# Patient Record
Sex: Female | Born: 1975 | Race: White | Hispanic: No | Marital: Married | Smoking: Former smoker
Health system: Southern US, Community
[De-identification: ages and names within clinical notes are randomized; demographics above are authoritative.]

## PROBLEM LIST (undated history)

## (undated) DIAGNOSIS — F900 Attention-deficit hyperactivity disorder, predominantly inattentive type: Secondary | ICD-10-CM

## (undated) DIAGNOSIS — F419 Anxiety disorder, unspecified: Secondary | ICD-10-CM

## (undated) DIAGNOSIS — N958 Other specified menopausal and perimenopausal disorders: Secondary | ICD-10-CM

## (undated) DIAGNOSIS — G43009 Migraine without aura, not intractable, without status migrainosus: Secondary | ICD-10-CM

## (undated) HISTORY — DX: Attention-deficit hyperactivity disorder, predominantly inattentive type: F90.0

## (undated) HISTORY — DX: Other specified menopausal and perimenopausal disorders: N95.8

## (undated) HISTORY — DX: Migraine without aura, not intractable, without status migrainosus: G43.009

## (undated) HISTORY — DX: Anxiety disorder, unspecified: F41.9

---

## 1998-04-13 ENCOUNTER — Inpatient Hospital Stay (HOSPITAL_COMMUNITY): Admission: AD | Admit: 1998-04-13 | Discharge: 1998-04-13 | Payer: Self-pay | Admitting: *Deleted

## 1998-07-24 ENCOUNTER — Inpatient Hospital Stay (HOSPITAL_COMMUNITY): Admission: AD | Admit: 1998-07-24 | Discharge: 1998-07-24 | Payer: Self-pay | Admitting: Obstetrics

## 1999-12-17 ENCOUNTER — Inpatient Hospital Stay (HOSPITAL_COMMUNITY): Admission: AD | Admit: 1999-12-17 | Discharge: 1999-12-17 | Payer: Self-pay | Admitting: Obstetrics & Gynecology

## 1999-12-21 ENCOUNTER — Inpatient Hospital Stay (HOSPITAL_COMMUNITY): Admission: AD | Admit: 1999-12-21 | Discharge: 1999-12-21 | Payer: Self-pay | Admitting: *Deleted

## 2000-07-19 ENCOUNTER — Inpatient Hospital Stay (HOSPITAL_COMMUNITY): Admission: EM | Admit: 2000-07-19 | Discharge: 2000-07-19 | Payer: Self-pay | Admitting: Obstetrics

## 2002-02-17 ENCOUNTER — Emergency Department (HOSPITAL_COMMUNITY): Admission: EM | Admit: 2002-02-17 | Discharge: 2002-02-17 | Payer: Self-pay | Admitting: Emergency Medicine

## 2002-02-17 ENCOUNTER — Encounter: Payer: Self-pay | Admitting: Emergency Medicine

## 2002-12-14 ENCOUNTER — Emergency Department (HOSPITAL_COMMUNITY): Admission: EM | Admit: 2002-12-14 | Discharge: 2002-12-15 | Payer: Self-pay | Admitting: Emergency Medicine

## 2002-12-15 ENCOUNTER — Encounter: Payer: Self-pay | Admitting: Emergency Medicine

## 2004-11-30 ENCOUNTER — Ambulatory Visit: Payer: Self-pay | Admitting: Psychiatry

## 2004-11-30 ENCOUNTER — Inpatient Hospital Stay (HOSPITAL_COMMUNITY): Admission: AD | Admit: 2004-11-30 | Discharge: 2004-12-03 | Payer: Self-pay | Admitting: Psychiatry

## 2005-03-21 ENCOUNTER — Emergency Department (HOSPITAL_COMMUNITY): Admission: EM | Admit: 2005-03-21 | Discharge: 2005-03-21 | Payer: Self-pay | Admitting: Emergency Medicine

## 2005-07-28 ENCOUNTER — Emergency Department (HOSPITAL_COMMUNITY): Admission: EM | Admit: 2005-07-28 | Discharge: 2005-07-29 | Payer: Self-pay | Admitting: *Deleted

## 2005-11-02 ENCOUNTER — Inpatient Hospital Stay (HOSPITAL_COMMUNITY): Admission: AD | Admit: 2005-11-02 | Discharge: 2005-11-02 | Payer: Self-pay | Admitting: Family Medicine

## 2005-11-15 ENCOUNTER — Inpatient Hospital Stay (HOSPITAL_COMMUNITY): Admission: RE | Admit: 2005-11-15 | Discharge: 2005-11-17 | Payer: Self-pay | Admitting: Psychiatry

## 2005-11-16 ENCOUNTER — Ambulatory Visit: Payer: Self-pay | Admitting: Psychiatry

## 2006-01-05 ENCOUNTER — Inpatient Hospital Stay (HOSPITAL_COMMUNITY): Admission: AD | Admit: 2006-01-05 | Discharge: 2006-01-05 | Payer: Self-pay | Admitting: Obstetrics and Gynecology

## 2006-02-14 ENCOUNTER — Ambulatory Visit (HOSPITAL_COMMUNITY): Admission: RE | Admit: 2006-02-14 | Discharge: 2006-02-14 | Payer: Self-pay | Admitting: Obstetrics and Gynecology

## 2006-02-24 ENCOUNTER — Inpatient Hospital Stay (HOSPITAL_COMMUNITY): Admission: AD | Admit: 2006-02-24 | Discharge: 2006-02-24 | Payer: Self-pay | Admitting: Obstetrics and Gynecology

## 2006-03-01 ENCOUNTER — Inpatient Hospital Stay (HOSPITAL_COMMUNITY): Admission: AD | Admit: 2006-03-01 | Discharge: 2006-03-02 | Payer: Self-pay | Admitting: Obstetrics and Gynecology

## 2006-03-08 ENCOUNTER — Inpatient Hospital Stay (HOSPITAL_COMMUNITY): Admission: AD | Admit: 2006-03-08 | Discharge: 2006-03-08 | Payer: Self-pay | Admitting: Obstetrics and Gynecology

## 2006-03-23 ENCOUNTER — Inpatient Hospital Stay (HOSPITAL_COMMUNITY): Admission: AD | Admit: 2006-03-23 | Discharge: 2006-03-23 | Payer: Self-pay | Admitting: Obstetrics and Gynecology

## 2006-04-25 ENCOUNTER — Inpatient Hospital Stay (HOSPITAL_COMMUNITY): Admission: AD | Admit: 2006-04-25 | Discharge: 2006-04-26 | Payer: Self-pay | Admitting: Obstetrics and Gynecology

## 2006-05-21 ENCOUNTER — Inpatient Hospital Stay (HOSPITAL_COMMUNITY): Admission: AD | Admit: 2006-05-21 | Discharge: 2006-05-21 | Payer: Self-pay | Admitting: Obstetrics and Gynecology

## 2006-06-06 ENCOUNTER — Inpatient Hospital Stay (HOSPITAL_COMMUNITY): Admission: AD | Admit: 2006-06-06 | Discharge: 2006-06-09 | Payer: Self-pay | Admitting: Obstetrics and Gynecology

## 2007-04-28 IMAGING — US US OB COMP +14 WK
1 series · 12 of 28 positions shown · non-contrast
Comparison: none

CLINICAL DATA: 16 weeks estimated gestational age with abdominal pain.
TECHNIQUE: Complete abdominal ultrasound examination was performed including evaluation of the liver, gallbladder, bile ducts, pancreas, kidneys, spleen, IVC, and abdominal aorta.
The liver parenchyma is homogeneous in echotexture without evidence for focal parenchymal abnormality or intrahepatic ductal dilatation.  The gallbladder is well-distended and shows no evidence for intraluminal stones or sludge.  No pericholecystic fluid or gallbladder wall thickening is seen.  The common bile duct measures 4.1 mm in AP width and this is within normal limits for size.
Both kidneys have a normal appearance with the right kidney having a sagittal length of 11.1 cm and the left kidney having a sagittal length of 10.9 cm.  No focal parenchymal abnormalities, hydronephrosis or evidence for intrarenal calculi are seen.  Evaluation of the bladder reveals the presence of bilateral ureteral jets mitigating against an early obstructive process.  The pancreas is seen in its entirety and is normal in size and echotexture.  The spleen is also normal in size and echotexture.  The aorta demonstrates a maximal AP width of 1.8 cm.  The extreme distal portion of the aorta could not be seen due to shadowing from the gravid uterus and the proximal aorta has a normal appearance.  The proximal portion of the inferior vena cava is unremarkable.

[Series 1: us ob comp +14 wk · 0.24mm/px · 12 of 56 slices shown]
[im 3/56]
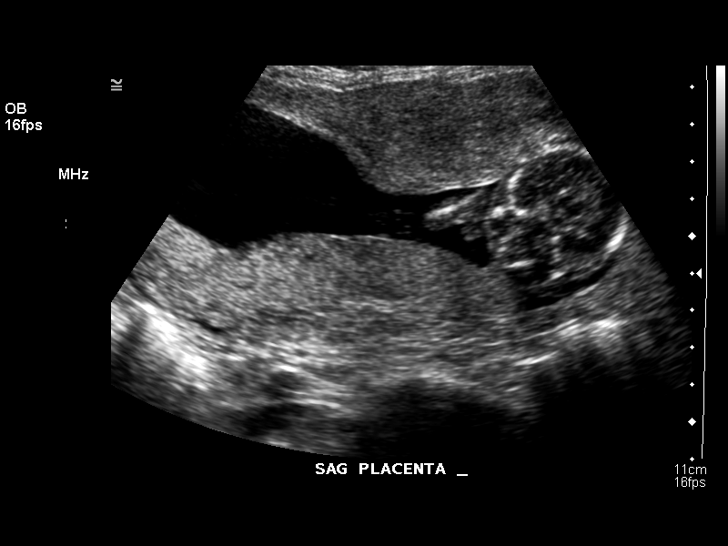
[im 7/56]
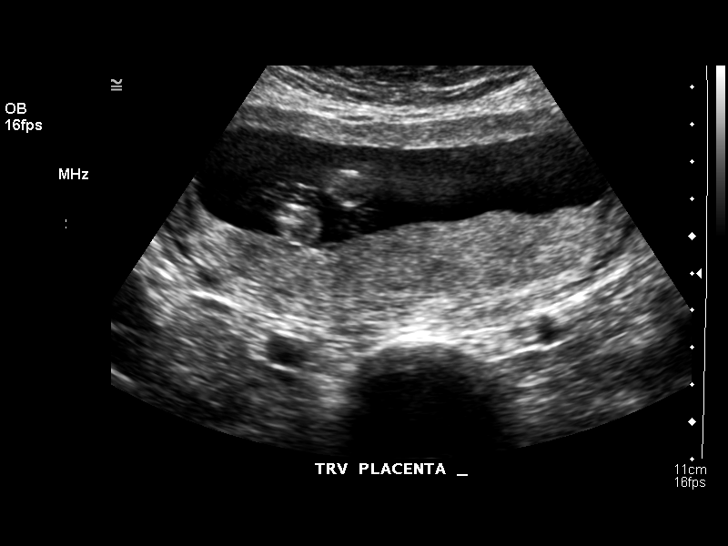
[im 11/56]
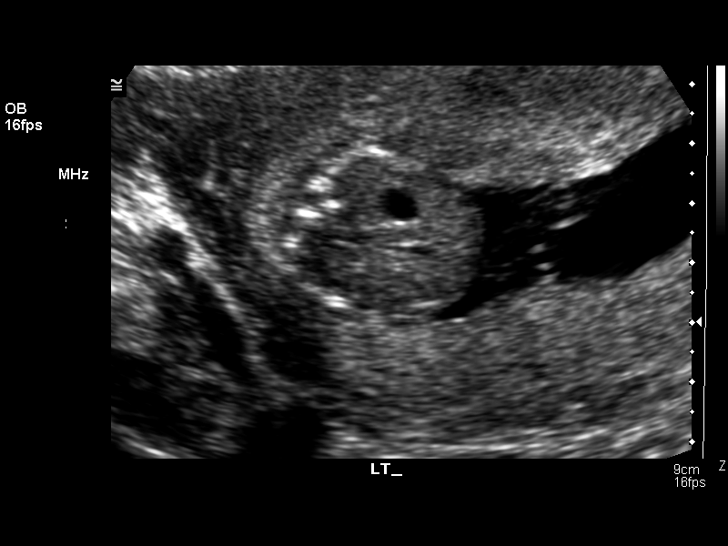
[im 17/56]
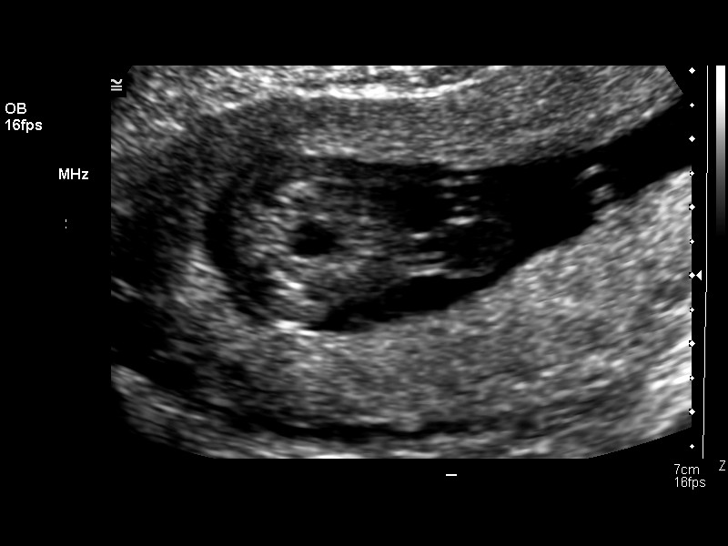
[im 21/56]
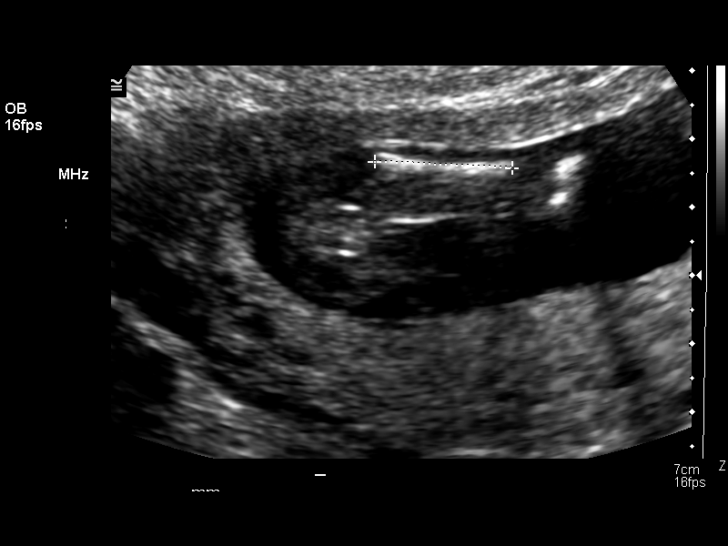
[im 25/56]
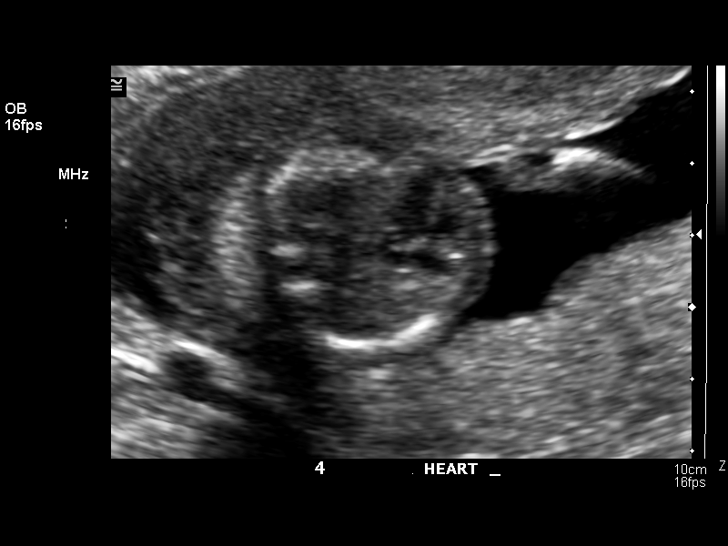
[im 31/56]
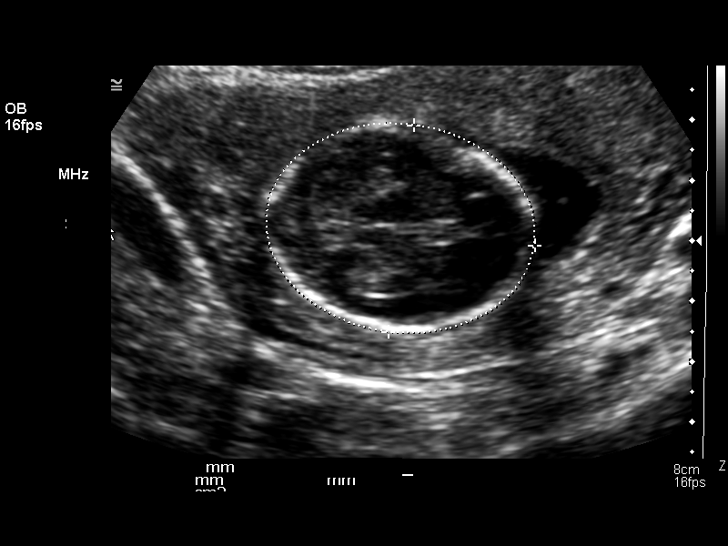
[im 35/56]
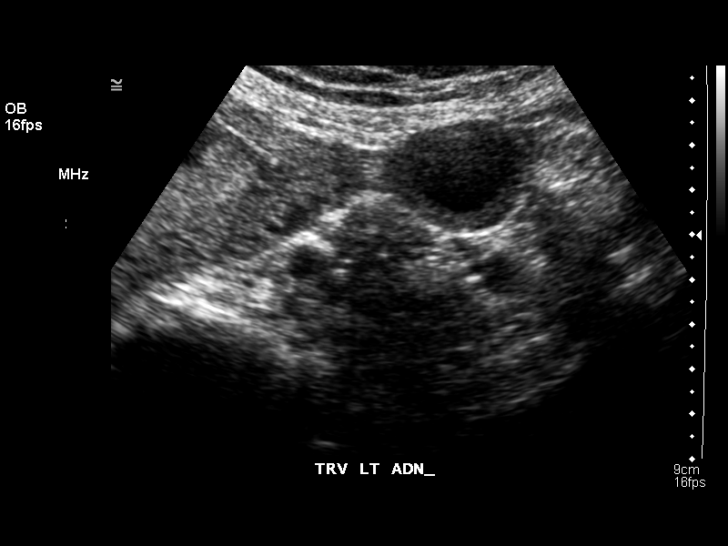
[im 39/56]
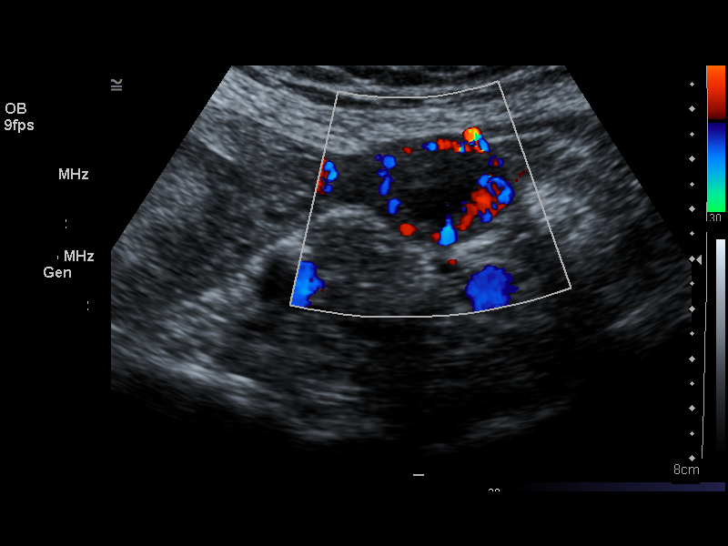
[im 45/56]
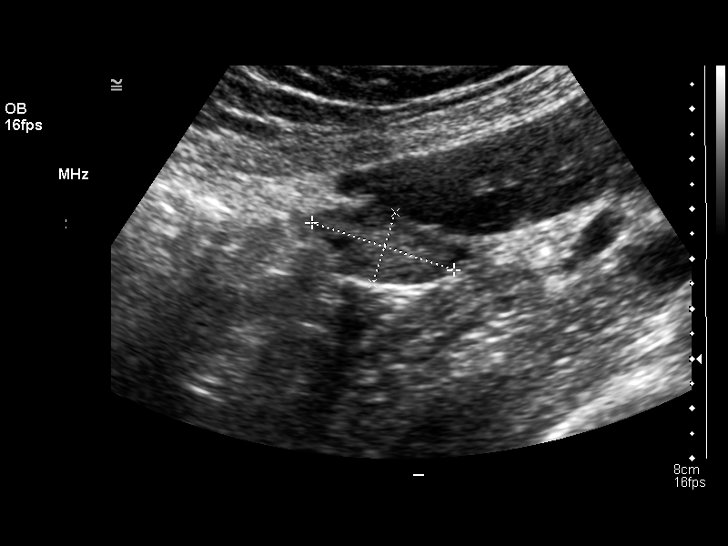
[im 49/56]
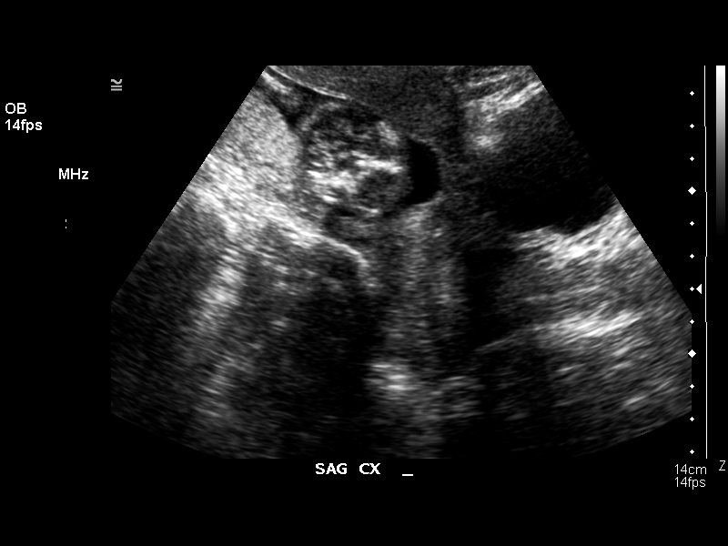
[im 53/56]
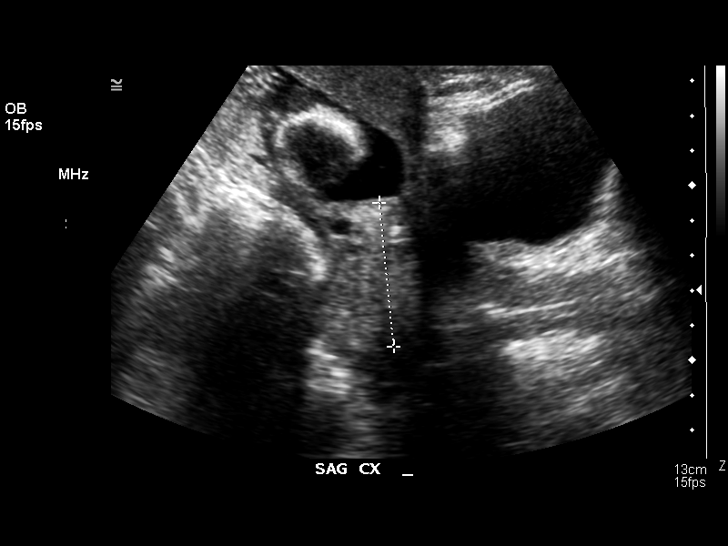

[12 of 28 positions shown; findings below may reference images not displayed]

OBSTETRICAL ULTRASOUND:
Number of Fetuses: 1
Heart Rate:  144
Movement:  Yes
Breathing:  No  
Presentation:  Cephalic
Placental Location:  Posterior
Grade:  I
Previa:  No
Amniotic Fluid (Subjective):  Normal
Amniotic Fluid (Objective):   3.0 cm Vertical pocket 

FETAL BIOMETRY
BPD:  3.3 cm   16 w 3 d
HC:  12.5 cm  16 w 2 d
AC:  10.2 cm  16 w 2 d
FL:    1.9 cm  15 w 6 d

MEAN GA:  16 w 2 d  US EDC:  06/20/06

FETAL ANATOMY
Lateral Ventricles:    Visualized 
Thalami/CSP:      Visualized 
Posterior Fossa:  Not visualized 
Nuchal Region:    Not visualized 
Spine:      Not visualized 
4 Chamber Heart on Left:      Not visualized 
Stomach on Left:      Visualized 
3 Vessel Cord:    Visualized 
Cord Insertion site:    Visualized 
Kidneys:  Visualized 
Bladder:  Visualized 
Extremities:      Not visualized 

Evaluation limited by:  Early gestational age 

MATERNAL UTERINE AND ADNEXAL FINDINGS
Cervix: 4.4 cm Transabdominally.  The left ovary contains an unilocular simple cyst measuring 2.3 x 1.7 x 2.0 cm.  Normal right ovary.
IMPRESSION: 1.  Single intrauterine pregnancy demonstrating an estimated gestational age by ultrasound of 16 weeks and 2 days.  Correlation with assigned gestational age by initial ultrasound of 16 weeks and 0 days suggests appropriate growth.  
2.  A limited anatomic assessment was possible due to the early gestational age.  Evaluation of the fetal spine, heart, full views of the extremities, facial anatomy and distal extremities could not be undertaken.  The visualized portions of the fetal anatomy were unremarkable.  Follow-up evaluation for full anatomic assessment would be recommended in 2 to 3 weeks.    No focal placental abnormalities are noted. 
3.  Simple left ovarian cyst.  As the patient is greater than 16 weeks estimated gestational age, this is unlikely to represent a persistent corpus luteum cyst and may represent a benign neoplastic process.  This can be reevaluated at follow-up scanning.  

ABDOMEN ULTRASOUND:
IMPRESSION: Normal abdominal ultrasound.

## 2012-12-12 HISTORY — PX: ABDOMINAL HYSTERECTOMY: SHX81

## 2020-01-13 ENCOUNTER — Encounter: Payer: Self-pay | Admitting: Family Medicine

## 2020-01-13 ENCOUNTER — Telehealth: Payer: BC Managed Care – PPO | Admitting: Family Medicine

## 2020-01-13 DIAGNOSIS — J0101 Acute recurrent maxillary sinusitis: Secondary | ICD-10-CM

## 2020-01-13 DIAGNOSIS — F323 Major depressive disorder, single episode, severe with psychotic features: Secondary | ICD-10-CM | POA: Diagnosis not present

## 2020-01-13 DIAGNOSIS — F411 Generalized anxiety disorder: Secondary | ICD-10-CM | POA: Insufficient documentation

## 2020-01-13 DIAGNOSIS — N958 Other specified menopausal and perimenopausal disorders: Secondary | ICD-10-CM | POA: Diagnosis not present

## 2020-01-13 DIAGNOSIS — F900 Attention-deficit hyperactivity disorder, predominantly inattentive type: Secondary | ICD-10-CM

## 2020-01-13 DIAGNOSIS — G43001 Migraine without aura, not intractable, with status migrainosus: Secondary | ICD-10-CM

## 2020-01-13 HISTORY — DX: Major depressive disorder, single episode, severe with psychotic features: F32.3

## 2020-01-13 MED ORDER — AZITHROMYCIN 250 MG PO TABS: ORAL_TABLET | ORAL | 0 refills | Status: DC

## 2020-01-13 NOTE — Progress Notes (Signed)
Virtual Visit via Telephone Note   This visit type was conducted due to national recommendations for restrictions regarding the COVID-19 Pandemic (e.g. social distancing) in an effort to limit this patient's exposure and mitigate transmission in our community.  Due to her co-morbid illnesses, this patient is at least at moderate risk for complications without adequate follow up.  This format is felt to be most appropriate for this patient at this time.  The patient did not have access to video technology/had technical difficulties with video requiring transitioning to audio format only (telephone).  All issues noted in this document were discussed and addressed.  No physical exam could be performed with this format.  Patient verbally consented to a telehealth visit.   Date:  01/24/2020   ID:  Lauren Contreras, DOB 07/10/1976, MRN [id number]  Patient Location: Home Provider Location: Office  PCP:  Rochel Brome, MD    Chief Complaint  Patient presents with  . Sinusitis    HPI Patient is in today for sinusitis. Patient is complaining of discomfort I her cheeks, behind her eyes, and her teeth hurting x few days. Denies fever, chills, or sweats. She has not tried any medicines. 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  Past Medical History:  Diagnosis Date  . ADHD (attention deficit hyperactivity disorder), inattentive type   . Anxiety   . Major depressive disorder, single episode with psychotic features ([place]) 01/13/2020  . Migraine without aura, not intractable, without status migrainosus   . Other specified menopausal and perimenopausal disorders     Past Surgical History:  Procedure Laterality Date  . ABDOMINAL HYSTERECTOMY  2014    Family History  Problem Relation Age of Onset  . Hypertension Mother   . COPD Mother   . CAD Father   . Hypertension Father   . Diabetes type II Father   . Bipolar disorder Sister   . Lung cancer Maternal Grandfather    . Seizures Other     Social History   Socioeconomic History  . Marital status: Married    Spouse name: Not on file  . Number of children: 3  . Years of education: Not on file  . Highest education level: Not on file  Occupational History  . Occupation: Pharmacist, hospital  Tobacco Use  . Smoking status: Former Smoker    Quit date: 2012    Years since quitting: 9.1  . Smokeless tobacco: Never Used  Substance and Sexual Activity  . Alcohol use: Never  . Drug use: Never  . Sexual activity: Not on file  Other Topics Concern  . Not on file  Social History Narrative  . Not on file   Social Determinants of Health   Financial Resource Strain:   . Difficulty of Paying Living Expenses: Not on file  Food Insecurity:   . Worried About Charity fundraiser in the Last Year: Not on file  . Ran Out of Food in the Last Year: Not on file  Transportation Needs:   . Lack of Transportation (Medical): Not on file  . Lack of Transportation (Non-Medical): Not on file  Physical Activity:   . Days of Exercise per Week: Not on file  . Minutes of Exercise per Session: Not on file  Stress:   . Feeling of Stress : Not on file  Social Connections:   . Frequency of Communication with Friends and Family: Not on file  . Frequency of Social Gatherings with Friends  and Family: Not on file  . Attends Religious Services: Not on file  . Active Member of Clubs or Organizations: Not on file  . Attends Archivist Meetings: Not on file  . Marital Status: Not on file  Intimate Partner Violence:   . Fear of Current or Ex-Partner: Not on file  . Emotionally Abused: Not on file  . Physically Abused: Not on file  . Sexually Abused: Not on file    Outpatient Medications Prior to Visit  Medication Sig Dispense Refill  . estradiol (ESTRACE) 2 MG tablet Take 2 mg by mouth daily.    Marland Kitchen estrogens, conjugated, (PREMARIN) 1.25 MG tablet TAKE 2 TABLETS DAILY.    Marland Kitchen lamoTRIgine (LAMICTAL) 150 MG tablet Take 150 mg  by mouth daily.    Marland Kitchen lisdexamfetamine (VYVANSE) 70 MG capsule Take 70 mg by mouth daily.    Marland Kitchen LORazepam (ATIVAN) 0.5 MG tablet Take 0.5 mg by mouth daily. prn    . Ubrogepant (UBRELVY) 100 MG TABS Take 100 mg by mouth daily.    . valACYclovir (VALTREX) 500 MG tablet Take 500 mg by mouth 2 (two) times daily. prn     No facility-administered medications prior to visit.    Allergies  Allergen Reactions  . Sulfur Nausea Only and Rash    Review of Systems  Constitutional: Negative for chills and fever.  HENT: Positive for sinus pain. Negative for congestion and ear pain.   Respiratory: Negative for cough and shortness of breath.   Cardiovascular: Negative for chest pain.       Objective:    Physical Exam  There were no vitals taken for this visit. Wt Readings from Last 3 Encounters:  No data found for Wt    Health Maintenance Due  Topic Date Due  . HIV Screening  05/18/1993  . TETANUS/TDAP  05/18/1997  . PAP SMEAR-Modifier  05/19/1999  . INFLUENZA VACCINE  07/13/2019    There are no preventive care reminders to display for this patient.   No results found for: TSH No results found for: WBC, HGB, HCT, MCV, PLT No results found for: NA, K, CHLORIDE, CO2, GLUCOSE, BUN, CREATININE, BILITOT, ALKPHOS, AST, ALT, PROT, ALBUMIN, CALCIUM, ANIONGAP, EGFR, GFR No results found for: CHOL No results found for: HDL No results found for: LDLCALC No results found for: TRIG No results found for: CHOLHDL No results found for: HGBA1C     Assessment & Plan:   Problem List Items Addressed This Visit      Cardiovascular and Mediastinum   Migraine without aura, not intractable, with status migrainosus   Relevant Medications   Ubrogepant (UBRELVY) 100 MG TABS   lamoTRIgine (LAMICTAL) 150 MG tablet     Other   Major depressive disorder, single episode with psychotic features (HCC)   Relevant Medications   LORazepam (ATIVAN) 0.5 MG tablet   Attention deficit hyperactivity disorder  (ADHD), inattentive type, mild   Other specified menopausal and perimenopausal disorders   Generalized anxiety disorder   Relevant Medications   LORazepam (ATIVAN) 0.5 MG tablet    Other Visit Diagnoses    Acute recurrent maxillary sinusitis    -  Primary   Relevant Medications   azithromycin (ZITHROMAX) 250 MG tablet   valACYclovir (VALTREX) 500 MG tablet       Meds ordered this encounter  Medications  . azithromycin (ZITHROMAX) 250 MG tablet    Sig: 2 PILLS ON FIRST DAY, THEN TAPER TO 1 PILL X 4 MORE DAYS FOR SINUSITIS.  Dispense:  6 tablet    Refill:  0     Rochel Brome, MD

## 2020-01-14 ENCOUNTER — Encounter: Payer: Self-pay | Admitting: Family Medicine

## 2020-01-21 ENCOUNTER — Encounter: Payer: Self-pay | Admitting: Family Medicine

## 2020-01-23 ENCOUNTER — Encounter: Payer: Self-pay | Admitting: Family Medicine

## 2020-02-03 ENCOUNTER — Other Ambulatory Visit: Payer: Self-pay

## 2020-02-03 MED ORDER — LISDEXAMFETAMINE DIMESYLATE 70 MG PO CAPS: 70.0000 mg | ORAL_CAPSULE | Freq: Every day | ORAL | 0 refills | Status: DC

## 2020-02-04 ENCOUNTER — Other Ambulatory Visit: Payer: Self-pay

## 2020-03-06 ENCOUNTER — Other Ambulatory Visit: Payer: Self-pay

## 2020-03-08 MED ORDER — LISDEXAMFETAMINE DIMESYLATE 70 MG PO CAPS: 70.0000 mg | ORAL_CAPSULE | Freq: Every day | ORAL | 0 refills | Status: DC

## 2020-03-08 NOTE — Telephone Encounter (Signed)
Needs appt prior to refilling again. kc

## 2020-03-24 ENCOUNTER — Ambulatory Visit: Payer: BC Managed Care – PPO

## 2020-03-24 ENCOUNTER — Telehealth (INDEPENDENT_AMBULATORY_CARE_PROVIDER_SITE_OTHER): Payer: BC Managed Care – PPO | Admitting: Family Medicine

## 2020-03-24 VITALS — Temp 99.0°F | Ht 66.0 in | Wt 148.0 lb

## 2020-03-24 DIAGNOSIS — R5081 Fever presenting with conditions classified elsewhere: Secondary | ICD-10-CM

## 2020-03-24 DIAGNOSIS — J028 Acute pharyngitis due to other specified organisms: Secondary | ICD-10-CM

## 2020-03-24 NOTE — Progress Notes (Signed)
This visit type was conducted due to national recommendations for restrictions regarding the COVID-19 Pandemic (e.g. social distancing) in an effort to limit this patient's exposure and mitigate transmission in our community.  Due to her co-morbid illnesses, this patient is at least at moderate risk for complications without adequate follow up.  This format is felt to be most appropriate for this patient at this time.  The patient did not have access to video technology/had technical difficulties with video requiring transitioning to audio format only (telephone).  All issues noted in this document were discussed and addressed.  No physical exam could be performed with this format.  Patient verbally consented to a telehealth visit.   Patient Location: Home Provider Location: Office  PCP:  Rochel Brome, MD   Evaluation Performed:  Acute Office Visit  Subjective:    Patient ID: Lauren Contreras, female    DOB: 1976-06-30, 44 y.o.   MRN: [id number]  Chief Complaint  Patient presents with  . Sore Throat    HPI Patient is in today for sore throat since this morning. Feels like knives inside throat. Fever to 100. Pt has chills and sweats. No achy. Complaining fatigue. Ears feel full of fluid. Denies headache. Patient has no issues with smell/taste issues. No covid exposures. No covid vaccines. Bland drinks.   Past Medical History:  Diagnosis Date  . ADHD (attention deficit hyperactivity disorder), inattentive type   . Anxiety   . Major depressive disorder, single episode with psychotic features ([place]) 01/13/2020  . Migraine without aura, not intractable, without status migrainosus   . Other specified menopausal and perimenopausal disorders     Past Surgical History:  Procedure Laterality Date  . ABDOMINAL HYSTERECTOMY  2014    Family History  Problem Relation Age of Onset  . Hypertension Mother   . COPD Mother   . CAD Father   . Hypertension Father   . Diabetes type II Father   .  Bipolar disorder Sister   . Lung cancer Maternal Grandfather   . Seizures Other     Social History   Socioeconomic History  . Marital status: Married    Spouse name: Not on file  . Number of children: 3  . Years of education: Not on file  . Highest education level: Not on file  Occupational History  . Occupation: Pharmacist, hospital  Tobacco Use  . Smoking status: Former Smoker    Quit date: 2012    Years since quitting: 9.2  . Smokeless tobacco: Never Used  Substance and Sexual Activity  . Alcohol use: Never  . Drug use: Never  . Sexual activity: Not on file  Other Topics Concern  . Not on file  Social History Narrative  . Not on file   Social Determinants of Health   Financial Resource Strain:   . Difficulty of Paying Living Expenses:   Food Insecurity:   . Worried About Charity fundraiser in the Last Year:   . Arboriculturist in the Last Year:   Transportation Needs:   . Film/video editor (Medical):   Marland Kitchen Lack of Transportation (Non-Medical):   Physical Activity:   . Days of Exercise per Week:   . Minutes of Exercise per Session:   Stress:   . Feeling of Stress :   Social Connections:   . Frequency of Communication with Friends and Family:   . Frequency of Social Gatherings with Friends and Family:   . Attends Religious Services:   .  Active Member of Clubs or Organizations:   . Attends Archivist Meetings:   Marland Kitchen Marital Status:   Intimate Partner Violence:   . Fear of Current or Ex-Partner:   . Emotionally Abused:   Marland Kitchen Physically Abused:   . Sexually Abused:     Outpatient Medications Prior to Visit  Medication Sig Dispense Refill  . azithromycin (ZITHROMAX) 250 MG tablet 2 PILLS ON FIRST DAY, THEN TAPER TO 1 PILL X 4 MORE DAYS FOR SINUSITIS. 6 tablet 0  . estradiol (ESTRACE) 2 MG tablet Take 2 mg by mouth daily.    Marland Kitchen estrogens, conjugated, (PREMARIN) 1.25 MG tablet TAKE 2 TABLETS DAILY.    Marland Kitchen lamoTRIgine (LAMICTAL) 150 MG tablet Take 150 mg by mouth  daily.    Marland Kitchen lisdexamfetamine (VYVANSE) 70 MG capsule Take 1 capsule (70 mg total) by mouth daily. 30 capsule 0  . LORazepam (ATIVAN) 0.5 MG tablet Take 0.5 mg by mouth daily. prn    . Ubrogepant (UBRELVY) 100 MG TABS Take 100 mg by mouth daily.    . valACYclovir (VALTREX) 500 MG tablet Take 500 mg by mouth 2 (two) times daily. prn     No facility-administered medications prior to visit.    Allergies  Allergen Reactions  . Sulfur Nausea Only and Rash    Review of Systems  Constitutional: Positive for chills, fatigue and fever.  HENT: Positive for sore throat. Negative for congestion, ear pain, sinus pressure and sinus pain.        Ears feel full.   Respiratory: Negative for cough and shortness of breath.   Cardiovascular: Negative for chest pain.  Gastrointestinal: Negative for abdominal pain, constipation, diarrhea, nausea and vomiting.  Musculoskeletal: Positive for myalgias.       Objective:    Physical Exam: Sounds hoarse on phone. No respiratory distress.  Temp 99 F (37.2 C)   Ht 5' 6"  (1.676 m)   Wt 148 lb (67.1 kg)   BMI 23.89 kg/m  Wt Readings from Last 3 Encounters:  03/24/20 148 lb (67.1 kg)    Health Maintenance Due  Topic Date Due  . HIV Screening  Never done  . TETANUS/TDAP  Never done  . PAP SMEAR-Modifier  Never done    There are no preventive care reminders to display for this patient.   No results found for: TSH No results found for: WBC, HGB, HCT, MCV, PLT No results found for: NA, K, CHLORIDE, CO2, GLUCOSE, BUN, CREATININE, BILITOT, ALKPHOS, AST, ALT, PROT, ALBUMIN, CALCIUM, ANIONGAP, EGFR, GFR No results found for: CHOL No results found for: HDL No results found for: LDLCALC No results found for: TRIG No results found for: CHOLHDL No results found for: HGBA1C     Assessment & Plan:  1. Acute pharyngitis due to other specified organisms Pt to come for testing from the car. - Rapid Strep A - negative. Recommend bland diet. Plenty of  fluids.  2. Fever in other diseases Pt to come for testing from the car. - POC COVID-19 - rapid test was negative. - Novel Coronavirus, NAA ([facility])   Follow up: as needed.    Rochel Brome, MD

## 2020-03-25 LAB — NOVEL CORONAVIRUS, NAA: SARS-CoV-2, NAA: NOT DETECTED

## 2020-03-28 ENCOUNTER — Other Ambulatory Visit: Payer: Self-pay | Admitting: Family Medicine

## 2020-03-29 ENCOUNTER — Encounter: Payer: Self-pay | Admitting: Family Medicine

## 2020-03-29 DIAGNOSIS — R509 Fever, unspecified: Secondary | ICD-10-CM | POA: Insufficient documentation

## 2020-03-29 DIAGNOSIS — J028 Acute pharyngitis due to other specified organisms: Secondary | ICD-10-CM | POA: Insufficient documentation

## 2020-04-15 ENCOUNTER — Other Ambulatory Visit: Payer: Self-pay

## 2020-04-16 MED ORDER — LISDEXAMFETAMINE DIMESYLATE 70 MG PO CAPS: 70.0000 mg | ORAL_CAPSULE | Freq: Every day | ORAL | 0 refills | Status: DC

## 2020-05-15 ENCOUNTER — Other Ambulatory Visit: Payer: Self-pay

## 2020-05-15 MED ORDER — LISDEXAMFETAMINE DIMESYLATE 70 MG PO CAPS: 70.0000 mg | ORAL_CAPSULE | Freq: Every day | ORAL | 0 refills | Status: DC

## 2020-05-31 ENCOUNTER — Other Ambulatory Visit: Payer: Self-pay | Admitting: Family Medicine

## 2020-06-22 ENCOUNTER — Other Ambulatory Visit: Payer: Self-pay

## 2020-06-22 MED ORDER — LISDEXAMFETAMINE DIMESYLATE 70 MG PO CAPS: 70.0000 mg | ORAL_CAPSULE | Freq: Every day | ORAL | 0 refills | Status: DC

## 2020-07-21 ENCOUNTER — Other Ambulatory Visit: Payer: Self-pay

## 2020-07-21 ENCOUNTER — Encounter: Payer: Self-pay | Admitting: Family Medicine

## 2020-07-21 ENCOUNTER — Ambulatory Visit: Payer: BC Managed Care – PPO | Admitting: Family Medicine

## 2020-07-21 VITALS — BP 120/74 | HR 86 | Temp 97.8°F | Ht 66.0 in | Wt 153.0 lb

## 2020-07-21 DIAGNOSIS — Z1231 Encounter for screening mammogram for malignant neoplasm of breast: Secondary | ICD-10-CM

## 2020-07-21 DIAGNOSIS — L304 Erythema intertrigo: Secondary | ICD-10-CM

## 2020-07-21 DIAGNOSIS — F900 Attention-deficit hyperactivity disorder, predominantly inattentive type: Secondary | ICD-10-CM

## 2020-07-21 DIAGNOSIS — F330 Major depressive disorder, recurrent, mild: Secondary | ICD-10-CM | POA: Diagnosis not present

## 2020-07-21 MED ORDER — FLUCONAZOLE 100 MG PO TABS: 100.0000 mg | ORAL_TABLET | Freq: Every day | ORAL | 0 refills | Status: DC

## 2020-07-21 MED ORDER — LISDEXAMFETAMINE DIMESYLATE 70 MG PO CAPS: 70.0000 mg | ORAL_CAPSULE | Freq: Every day | ORAL | 0 refills | Status: DC

## 2020-07-21 NOTE — Progress Notes (Signed)
Subjective:  Patient ID: Lauren Contreras, female    DOB: 07/07/1976  Age: 44 y.o. MRN: [id number]  Chief Complaint  Patient presents with  . Depression  . ADHD    HPI  Concerning major depressive disorder, recurrent, mild, this is a routine follow-up.  She cannot recall when the diagnosis of depression was made. Current medications include Lamictal 100 mg QD. Has not needed her lorazepam. She denies anhedonia, anxious mood, a change in appetite, altered sleep habits, crying spells, decreased ability to concentrate, fatigue or guilt.  The symptoms are fairly infrequent.  Presently, Lauren Contreras denies suicidal ideation.     Attention-deficit hyperactivity disorder, predominantly inattentive type details; symptoms include easy distractability, inability to complete tasks, and difficulty sustaining attention.  These problems first became evident during adulthood.  Lauren Contreras completed college.  No school years have had to be repeated.  Job performance is considered good overall.  Currently on Vyvanse 70 mg QD. Her medicine is still helping. 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  Current Outpatient Medications on File Prior to Visit  Medication Sig Dispense Refill  . estradiol (ESTRACE) 2 MG tablet TAKE 1 TABLET BY MOUTH EVERY DAY 90 tablet 1  . LamoTRIgine 100 MG TB24 24 hour tablet TAKE 1 TABLET BY MOUTH EVERY DAY 90 tablet 1  . valACYclovir (VALTREX) 500 MG tablet Take 500 mg by mouth 2 (two) times daily. prn     No current facility-administered medications on file prior to visit.   Past Medical History:  Diagnosis Date  . ADHD (attention deficit hyperactivity disorder), inattentive type   . Anxiety   . Major depressive disorder, single episode with psychotic features (HCC) 01/13/2020  . Migraine without aura, not intractable, without status migrainosus   . Other specified menopausal and perimenopausal disorders    Past Surgical History:  Procedure  Laterality Date  . ABDOMINAL HYSTERECTOMY  2014    Family History  Problem Relation Age of Onset  . Hypertension Mother   . COPD Mother   . CAD Father   . Hypertension Father   . Diabetes type II Father   . Bipolar disorder Sister   . Lung cancer Maternal Grandfather   . Seizures Other    Social History   Socioeconomic History  . Marital status: Married    Spouse name: Not on file  . Number of children: 3  . Years of education: Not on file  . Highest education level: Not on file  Occupational History  . Occupation: Runner, broadcasting/film/video  Tobacco Use  . Smoking status: Former Smoker    Quit date: 2012    Years since quitting: 9.6  . Smokeless tobacco: Never Used  Substance and Sexual Activity  . Alcohol use: Never  . Drug use: Never  . Sexual activity: Not on file  Other Topics Concern  . Not on file  Social History Narrative  . Not on file   Social Determinants of Health   Financial Resource Strain:   . Difficulty of Paying Living Expenses:   Food Insecurity:   . Worried About Programme researcher, broadcasting/film/video in the Last Year:   . Barista in the Last Year:   Transportation Needs:   . Freight forwarder (Medical):   Marland Kitchen Lack of Transportation (Non-Medical):   Physical Activity:   . Days of Exercise per Week:   . Minutes of Exercise per Session:   Stress:   . Feeling of Stress :  Social Connections:   . Frequency of Communication with Friends and Family:   . Frequency of Social Gatherings with Friends and Family:   . Attends Religious Services:   . Active Member of Clubs or Organizations:   . Attends Banker Meetings:   Marland Kitchen Marital Status:     Review of Systems  Constitutional: Negative for chills and fever.  HENT: Negative for congestion, rhinorrhea and sore throat.   Respiratory: Negative for shortness of breath.   Cardiovascular: Negative for chest pain.  Gastrointestinal: Negative for abdominal pain, constipation, diarrhea, nausea and vomiting.    Endocrine: Negative for polydipsia and polyphagia.  Genitourinary: Negative for dysuria and hematuria.  Musculoskeletal: Positive for back pain.  Skin: Positive for rash (left axilla-itchy).  Neurological: Positive for headaches (Migraines 2-3 times a month).  Psychiatric/Behavioral: Negative for decreased concentration (Controlled with medications).     Objective:  BP 120/74   Pulse 86   Temp 97.8 F (36.6 C)   Ht 5\' 6"  (1.676 m)   Wt 153 lb (69.4 kg)   SpO2 99%   BMI 24.69 kg/m   BP/Weight 07/21/2020 03/24/2020  Systolic BP 120 -  Diastolic BP 74 -  Wt. (Lbs) 153 148  BMI 24.69 23.89    Physical Exam Vitals reviewed.  Constitutional:      Appearance: Normal appearance. She is normal weight.  Cardiovascular:     Rate and Rhythm: Normal rate and regular rhythm.     Pulses: Normal pulses.     Heart sounds: Normal heart sounds.  Pulmonary:     Effort: Pulmonary effort is normal. No respiratory distress.     Breath sounds: Normal breath sounds.  Abdominal:     General: Abdomen is flat. Bowel sounds are normal.     Palpations: Abdomen is soft.     Tenderness: There is no abdominal tenderness.  Skin:    Findings: Rash (mild erythema under left axilla. ) present.  Neurological:     Mental Status: She is alert and oriented to person, place, and time.  Psychiatric:        Mood and Affect: Mood normal.        Behavior: Behavior normal.     Diabetic Foot Exam - Simple   No data filed       No results found for: WBC, HGB, HCT, PLT, GLUCOSE, CHOL, TRIG, HDL, LDLDIRECT, LDLCALC, ALT, AST, NA, K, CL, CREATININE, BUN, CO2, TSH, PSA, INR, GLUF, HGBA1C, MICROALBUR    Assessment & Plan:   1. Intertrigo  Start on fluconazole.  2. Depression, major, recurrent, mild (HCC) The current medical regimen is effective;  continue present plan and medications.  3. 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  - lisdexamfetamine (VYVANSE) 70 MG capsule; Take 1 capsule (70 mg total) by mouth daily.  Dispense: 30 capsule; Refill: 0  4. Encounter for screening mammogram for malignant neoplasm of breast - MM DIGITAL SCREENING BILATERAL  Meds ordered this encounter  Medications  . fluconazole (DIFLUCAN) 100 MG tablet    Sig: Take 1 tablet (100 mg total) by mouth daily.    Dispense:  7 tablet    Refill:  0  . lisdexamfetamine (VYVANSE) 70 MG capsule    Sig: Take 1 capsule (70 mg total) by mouth daily.    Dispense:  30 capsule    Refill:  0  Orders Placed This Encounter  Procedures  . MM DIGITAL SCREENING BILATERAL     Follow-up: Return in about 4 months (around 11/20/2020) for nonfasting.  An After Visit Summary was printed and given to the patient.  Blane Ohara [facility] [phone]

## 2020-07-24 ENCOUNTER — Telehealth: Payer: Self-pay

## 2020-07-24 NOTE — Telephone Encounter (Signed)
      Left voice mail letting patient know date and time of her mammogram.  It is scheduled with [facility] on July 28, 2020 checking in at 6 pm.  Creola Corn, LPN 86/75/44 9:20 AM

## 2020-08-28 ENCOUNTER — Other Ambulatory Visit: Payer: Self-pay

## 2020-08-28 DIAGNOSIS — F900 Attention-deficit hyperactivity disorder, predominantly inattentive type: Secondary | ICD-10-CM

## 2020-08-28 MED ORDER — LISDEXAMFETAMINE DIMESYLATE 70 MG PO CAPS: 70.0000 mg | ORAL_CAPSULE | Freq: Every day | ORAL | 0 refills | Status: DC

## 2020-09-17 ENCOUNTER — Ambulatory Visit (INDEPENDENT_AMBULATORY_CARE_PROVIDER_SITE_OTHER): Payer: BC Managed Care – PPO | Admitting: Family Medicine

## 2020-09-17 ENCOUNTER — Other Ambulatory Visit: Payer: Self-pay

## 2020-09-17 ENCOUNTER — Encounter: Payer: Self-pay | Admitting: Family Medicine

## 2020-09-17 VITALS — BP 122/88 | HR 82 | Temp 97.7°F | Ht 66.0 in | Wt 157.0 lb

## 2020-09-17 DIAGNOSIS — M79662 Pain in left lower leg: Secondary | ICD-10-CM | POA: Diagnosis not present

## 2020-09-17 NOTE — Progress Notes (Signed)
Acute Office Visit  Subjective:    Patient ID: Lauren Contreras, female    DOB: Aug 20, 1976, 44 y.o.   MRN: [id number]  Cc: leg pain  HPI Patient is in today for swelling left calf-pain deep and runs up. No knot. Takes Estrogen. Pt has noted no skin changes  Past Medical History:  Diagnosis Date  . ADHD (attention deficit hyperactivity disorder), inattentive type   . Anxiety   . Major depressive disorder, single episode with psychotic features (HCC) 01/13/2020  . Migraine without aura, not intractable, without status migrainosus   . Other specified menopausal and perimenopausal disorders     Past Surgical History:  Procedure Laterality Date  . ABDOMINAL HYSTERECTOMY  2014    Family History  Problem Relation Age of Onset  . Hypertension Mother   . COPD Mother   . CAD Father   . Hypertension Father   . Diabetes type II Father   . Bipolar disorder Sister   . Lung cancer Maternal Grandfather   . Seizures Other     Social History   Socioeconomic History  . Marital status: Married    Spouse name: Not on file  . Number of children: 3  . Years of education: Not on file  . Highest education level: Not on file  Occupational History  . Occupation: Runner, broadcasting/film/video  Tobacco Use  . Smoking status: Former Smoker    Quit date: 2012    Years since quitting: 9.7  . Smokeless tobacco: Never Used  Substance and Sexual Activity  . Alcohol use: Never  . Drug use: Never  . Sexual activity: Not on file  Other Topics Concern  . Not on file  Social History Narrative  . Not on file   Social Determinants of Health   Financial Resource Strain:   . Difficulty of Paying Living Expenses: Not on file  Food Insecurity:   . Worried About Programme researcher, broadcasting/film/video in the Last Year: Not on file  . Ran Out of Food in the Last Year: Not on file  Transportation Needs:   . Lack of Transportation (Medical): Not on file  . Lack of Transportation (Non-Medical): Not on file  Physical Activity:   . Days  of Exercise per Week: Not on file  . Minutes of Exercise per Session: Not on file  Stress:   . Feeling of Stress : Not on file  Social Connections:   . Frequency of Communication with Friends and Family: Not on file  . Frequency of Social Gatherings with Friends and Family: Not on file  . Attends Religious Services: Not on file  . Active Member of Clubs or Organizations: Not on file  . Attends Banker Meetings: Not on file  . Marital Status: Not on file  Intimate Partner Violence:   . Fear of Current or Ex-Partner: Not on file  . Emotionally Abused: Not on file  . Physically Abused: Not on file  . Sexually Abused: Not on file    Outpatient Medications Prior to Visit  Medication Sig Dispense Refill  . estradiol (ESTRACE) 2 MG tablet TAKE 1 TABLET BY MOUTH EVERY DAY 90 tablet 1  . LamoTRIgine 100 MG TB24 24 hour tablet TAKE 1 TABLET BY MOUTH EVERY DAY 90 tablet 1  . lisdexamfetamine (VYVANSE) 70 MG capsule Take 1 capsule (70 mg total) by mouth daily. 30 capsule 0  . valACYclovir (VALTREX) 500 MG tablet Take 500 mg by mouth 2 (two) times daily. prn    .  fluconazole (DIFLUCAN) 100 MG tablet Take 1 tablet (100 mg total) by mouth daily. 7 tablet 0   No facility-administered medications prior to visit.    Allergies  Allergen Reactions  . Sulfur Nausea Only and Rash    Review of Systems     Objective:    Physical Exam Musculoskeletal:        General: No deformity. Normal range of motion.     Comments: Left calf-37.5cm-tenderness, no eryth, no swelling Right calf-38cm  Skin:    Findings: No bruising or erythema.     BP 122/88 (BP Location: Left Arm, Patient Position: Sitting)   Pulse 82   Temp 97.7 F (36.5 C) (Temporal)   Wt 157 lb (71.2 kg)   SpO2 100%   BMI 25.34 kg/m  Wt Readings from Last 3 Encounters:  09/17/20 157 lb (71.2 kg)  07/21/20 153 lb (69.4 kg)  03/24/20 148 lb (67.1 kg)    Health Maintenance Due  Topic Date Due  . Hepatitis C  Screening  Never done  . HIV Screening  Never done  . TETANUS/TDAP  Never done  . PAP SMEAR-Modifier  Never done  . INFLUENZA VACCINE  Never done     Assessment & Plan:  1. Pain of left calf R/o DVT-if negative, heat , elevation, electrolyte replacement - US Venous Img Lower Unilateral Left; Future Nehal Shives Mat Carne, MD

## 2020-09-18 ENCOUNTER — Other Ambulatory Visit: Payer: Self-pay

## 2020-09-18 DIAGNOSIS — M79662 Pain in left lower leg: Secondary | ICD-10-CM

## 2020-09-20 ENCOUNTER — Other Ambulatory Visit: Payer: Self-pay | Admitting: Family Medicine

## 2020-09-24 ENCOUNTER — Telehealth (INDEPENDENT_AMBULATORY_CARE_PROVIDER_SITE_OTHER): Payer: BC Managed Care – PPO | Admitting: Nurse Practitioner

## 2020-09-24 ENCOUNTER — Encounter: Payer: Self-pay | Admitting: Nurse Practitioner

## 2020-09-24 VITALS — BP 138/88 | HR 90 | Temp 97.0°F | Ht 66.0 in | Wt 153.0 lb

## 2020-09-24 DIAGNOSIS — Z20822 Contact with and (suspected) exposure to covid-19: Secondary | ICD-10-CM

## 2020-09-24 DIAGNOSIS — Z7189 Other specified counseling: Secondary | ICD-10-CM | POA: Diagnosis not present

## 2020-09-24 NOTE — Patient Instructions (Addendum)
-  Continue to push fluids and rest -Advil/Tylenol OTC for fever, headache, and aches -Continue Advil Cold/Sinus and Mucinex -Call clinic if symptoms worsen or fail to improve, call on-call number for after hours or weekends questions

## 2020-09-24 NOTE — Progress Notes (Signed)
Virtual Visit via Telephone Note   This visit type was conducted due to national recommendations for restrictions regarding the COVID-19 Pandemic (e.g. social distancing) in an effort to limit this patient's exposure and mitigate transmission in our community.  Due to her co-morbid illnesses, this patient is at least at moderate risk for complications without adequate follow up.  This format is felt to be most appropriate for this patient at this time.  The patient did not have access to video technology/had technical difficulties with video requiring transitioning to audio format only (telephone).  All issues noted in this document were discussed and addressed.  No physical exam could be performed with this format.  Patient verbally consented to a telehealth visit.   Date:  09/24/2020   ID:  Lauren Contreras, DOB 25-Jun-1976, MRN [id number]  Patient Location: Home Provider Location: Home practice- [facility]  PCP:  Blane Ohara, MD     Chief Complaint: COVID positive home test Patient would like to be treated for her sinus infection.   History of Present Illness:    Lauren Contreras is a 44 y.o. female with onset of COVID-19 symptoms 4 days ago.The patient does have symptoms concerning for COVID-19 infection which include fever, congestion, headache, ear pressure, fatigued, loss of taste and smell .  A home COVID-19 test was positive. 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 She denies dyspnea currently. She does not meet criteria  to receive monoclonal antibody infusion due to BMI 25.35, non-smoker, and  She has been treating symptoms with Advil Cold-Sinus, Tylenol, Mucinex, fluids, and rest. She has received two Pfizer COVID-19 vaccinations.She had been seen in the clinic 7 days ago for left calf pain. An [place] ruled out DVT. That pain has subsided.   The patient does have symptoms  concerning for COVID-19 infection which include fever, congestion, headache, ear pressure, fatigued, loss of taste and smell .    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   Migraine without aura, not intractable, without status migrainosus    Other specified menopausal and perimenopausal disorders     Past Surgical History:  Procedure Laterality Date   ABDOMINAL HYSTERECTOMY  2014    Family History  Problem Relation Age of Onset   Hypertension Mother    COPD Mother    CAD Father    Hypertension Father    Diabetes type II Father    Bipolar disorder Sister    Lung cancer Maternal Grandfather    Seizures Other     Social History   Socioeconomic History   Marital status: Married    Spouse name: Not on file   Number of children: 3   Years of education: Not on file   Highest education level: Not on file  Occupational History   Occupation: Teacher  Tobacco Use   Smoking status: Former Smoker    Quit date: 2012    Years since quitting: 9.7   Smokeless tobacco: Never Used  Substance and Sexual Activity   Alcohol use: Never   Drug use: Never   Sexual activity: Not on file  Other Topics Concern   Not on file  Social History Narrative   Not on file   Social Determinants of Health  Financial Resource Strain:    Difficulty of Paying Living Expenses: Not on file  Food Insecurity:    Worried About Programme researcher, broadcasting/film/video in the Last Year: Not on file   [employer] of Food in the Last Year: Not on file  Transportation Needs:    Lack of Transportation (Medical): Not on file   Lack of Transportation (Non-Medical): Not on file  Physical Activity:    Days of Exercise per Week: Not on file   Minutes of Exercise per Session: Not on file  Stress:    Feeling of Stress : Not on file  Social  Connections:    Frequency of Communication with Friends and Family: Not on file   Frequency of Social Gatherings with Friends and Family: Not on file   Attends Religious Services: Not on file   Active Member of Clubs or Organizations: Not on file   Attends Banker Meetings: Not on file   Marital Status: Not on file  Intimate Partner Violence:    Fear of Current or Ex-Partner: Not on file   Emotionally Abused: Not on file   Physically Abused: Not on file   Sexually Abused: Not on file    Outpatient Medications Prior to Visit  Medication Sig Dispense Refill   estradiol (ESTRACE) 2 MG tablet TAKE 1 TABLET BY MOUTH EVERY DAY 90 tablet 1   LamoTRIgine 100 MG TB24 24 hour tablet TAKE 1 TABLET BY MOUTH EVERY DAY 90 tablet 1   lisdexamfetamine (VYVANSE) 70 MG capsule Take 1 capsule (70 mg total) by mouth daily. 30 capsule 0   valACYclovir (VALTREX) 500 MG tablet Take 500 mg by mouth 2 (two) times daily. prn         Allergies:   Sulfur   Social History   Tobacco Use   Smoking status: Former Smoker    Quit date: 2012    Years since quitting: 9.7   Smokeless tobacco: Never Used  Substance Use Topics   Alcohol use: Never   Drug use: Never     Review of Systems  Constitutional: Positive for chills, fever and malaise/fatigue.       Loss of taste and smell  HENT: Positive for congestion, ear pain, sinus pain and sore throat. Negative for ear discharge and nosebleeds.   Respiratory: Positive for cough. Negative for shortness of breath.   Cardiovascular: Negative for chest pain.  Gastrointestinal: Negative for constipation, diarrhea, nausea and vomiting.  Neurological: Positive for headaches.     Labs/Other Tests and Data Reviewed:       Wt Readings from Last 3 Encounters:  09/24/20 153 lb (69.4 kg)  09/17/20 157 lb (71.2 kg)  07/21/20 153 lb (69.4 kg)     Objective:    Vital Signs:  BP 138/88    Pulse 90    Temp (!) 97 F (36.1 C)    Ht 5'  6" (1.676 m)    Wt 153 lb (69.4 kg)    BMI 24.69 kg/m    Physical Exam Not performed due to telemedicine visit  ASSESSMENT & PLAN:     1. Advice given about COVID-19 virus by telephone  2. Encounter by telehealth for suspected COVID-19  Continue to push fluids and rest -Advil/Tylenol OTC for fever, headache, and aches -Continue Advil Cold/Sinus and Mucinex -Call clinic if symptoms worsen or fail to improve, call on-call number for after hours or weekends questions        COVID-19 Education: The signs and symptoms  of COVID-19 were discussed with the patient and how to seek care for testing (follow up with PCP or arrange E-visit). The importance of social distancing was discussed today.  Time:   Today, I have spent 11 minutes with the patient with telehealth technology discussing the above problems.    Follow Up:  Virtual Visit  prn  Lydia Guiles, DNP  09/24/2020 2032    [facility] [place]

## 2020-10-01 ENCOUNTER — Encounter: Payer: Self-pay | Admitting: Family Medicine

## 2020-10-01 ENCOUNTER — Telehealth (INDEPENDENT_AMBULATORY_CARE_PROVIDER_SITE_OTHER): Payer: BC Managed Care – PPO | Admitting: Family Medicine

## 2020-10-01 VITALS — BP 124/86 | HR 78 | Temp 99.0°F | Ht 65.0 in | Wt 157.0 lb

## 2020-10-01 DIAGNOSIS — J069 Acute upper respiratory infection, unspecified: Secondary | ICD-10-CM | POA: Diagnosis not present

## 2020-10-01 DIAGNOSIS — U071 COVID-19: Secondary | ICD-10-CM

## 2020-10-01 DIAGNOSIS — J018 Other acute sinusitis: Secondary | ICD-10-CM | POA: Diagnosis not present

## 2020-10-01 MED ORDER — AZITHROMYCIN 250 MG PO TABS
ORAL_TABLET | ORAL | 0 refills | Status: DC
Start: 2020-10-01 — End: 2020-12-24

## 2020-10-01 NOTE — Progress Notes (Signed)
Virtual Visit via Telephone Note   This visit type was conducted due to national recommendations for restrictions regarding the COVID-19 Pandemic (e.g. social distancing) in an effort to limit this patient's exposure and mitigate transmission in our community.  Due to her co-morbid illnesses, this patient is at least at moderate risk for complications without adequate follow up.  This format is felt to be most appropriate for this patient at this time.  The patient did not have access to video technology/had technical difficulties with video requiring transitioning to audio format only (telephone).  All issues noted in this document were discussed and addressed.  No physical exam could be performed with this format.  Patient verbally consented to a telehealth visit.   Date:  10/04/2020   ID:  Lauren Contreras, DOB 1976-10-12, MRN [id number]  Patient Location: Home Provider Location: Office/Clinic  PCP:  Blane Ohara, MD   Evaluation Performed:  Acute visit  Chief Complaint:  Sinus headache  History of Present Illness:    Lauren Contreras is a 43 y.o. female with positive home covid 19 test. Symptoms came on 09/20/2020. Test was positive on 09/20/2020 and again on 09/21/2020. Initial symptoms headache (whole head), fever, severe sinus congestion, severe fatigue. Loss of taste and smell.  Advil cold and sinus/tylenol. Was improving and then last night headache (sinuses), congestion, watery diarrhea, fever(99.5.) Pt became winded with walking. Some abdominal cramping last night. No nausea/vomiting. Decreased appetite.  No swelling in legs. No chest pain. The patient does have symptoms concerning for COVID-19 infection (fever, chills, cough, or new shortness of breath).    Past Medical History:  Diagnosis Date  . ADHD (attention deficit hyperactivity disorder), inattentive type   . Anxiety   . Major depressive disorder, single episode with psychotic features (HCC) 01/13/2020  . Migraine  without aura, not intractable, without status migrainosus   . Other specified menopausal and perimenopausal disorders     Past Surgical History:  Procedure Laterality Date  . ABDOMINAL HYSTERECTOMY  2014    Family History  Problem Relation Age of Onset  . Hypertension Mother   . COPD Mother   . CAD Father   . Hypertension Father   . Diabetes type II Father   . Bipolar disorder Sister   . Lung cancer Maternal Grandfather   . Seizures Other     Social History   Socioeconomic History  . Marital status: Married    Spouse name: Not on file  . Number of children: 3  . Years of education: Not on file  . Highest education level: Not on file  Occupational History  . Occupation: Runner, broadcasting/film/video  Tobacco Use  . Smoking status: Former Smoker    Quit date: 2012    Years since quitting: 9.8  . Smokeless tobacco: Never Used  Substance and Sexual Activity  . Alcohol use: Never  . Drug use: Never  . Sexual activity: Not on file  Other Topics Concern  . Not on file  Social History Narrative  . Not on file   Social Determinants of Health   Financial Resource Strain:   . Difficulty of Paying Living Expenses: Not on file  Food Insecurity:   . Worried About Programme researcher, broadcasting/film/video in the Last Year: Not on file  . Ran Out of Food in the Last Year: Not on file  Transportation Needs:   . Lack of Transportation (Medical): Not on file  . Lack of Transportation (Non-Medical): Not on file  Physical Activity:   .  Days of Exercise per Week: Not on file  . Minutes of Exercise per Session: Not on file  Stress:   . Feeling of Stress : Not on file  Social Connections:   . Frequency of Communication with Friends and Family: Not on file  . Frequency of Social Gatherings with Friends and Family: Not on file  . Attends Religious Services: Not on file  . Active Member of Clubs or Organizations: Not on file  . Attends Banker Meetings: Not on file  . Marital Status: Not on file  Intimate  Partner Violence:   . Fear of Current or Ex-Partner: Not on file  . Emotionally Abused: Not on file  . Physically Abused: Not on file  . Sexually Abused: Not on file    Outpatient Medications Prior to Visit  Medication Sig Dispense Refill  . estradiol (ESTRACE) 2 MG tablet TAKE 1 TABLET BY MOUTH EVERY DAY 90 tablet 1  . LamoTRIgine 100 MG TB24 24 hour tablet TAKE 1 TABLET BY MOUTH EVERY DAY 90 tablet 1  . lisdexamfetamine (VYVANSE) 70 MG capsule Take 1 capsule (70 mg total) by mouth daily. 30 capsule 0  . valACYclovir (VALTREX) 500 MG tablet Take 500 mg by mouth 2 (two) times daily. prn     No facility-administered medications prior to visit.    Allergies:   Sulfur   Social History   Tobacco Use  . Smoking status: Former Smoker    Quit date: 2012    Years since quitting: 9.8  . Smokeless tobacco: Never Used  Substance Use Topics  . Alcohol use: Never  . Drug use: Never     Review of Systems  Constitutional: Negative for chills and fever.  HENT: Positive for congestion. Negative for ear pain, sinus pain and sore throat.   Cardiovascular: Negative for chest pain and palpitations.  Gastrointestinal: Positive for abdominal pain and diarrhea. Negative for constipation, nausea and vomiting.  Genitourinary: Negative for frequency and urgency.  Musculoskeletal: Negative for back pain, falls, joint pain and myalgias.  Neurological: Positive for headaches. Negative for dizziness and weakness.     Labs/Other Tests and Data Reviewed:    Recent Labs: No results found for requested labs within last 8760 hours.   Recent Lipid Panel No results found for: CHOL, TRIG, HDL, CHOLHDL, LDLCALC, LDLDIRECT  Wt Readings from Last 3 Encounters:  10/01/20 157 lb (71.2 kg)  09/24/20 153 lb (69.4 kg)  09/17/20 157 lb (71.2 kg)     Objective:    Vital Signs:  BP 124/86   Pulse 78   Temp 99 F (37.2 C)   Ht 5\' 5"  (1.651 m)   Wt 157 lb (71.2 kg)   BMI 26.13 kg/m    Physical Exam    Patient sounds very congested on the phone. ASSESSMENT & PLAN:   1. Acute non-recurrent sinusitis of other sinus Recommend over-the-counter cough and congestion medications.  Consider antihistamines. Recommend push fluids. Prescription for Zithromax sent. - azithromycin (ZITHROMAX) 250 MG tablet; 2 DAILY FOR FIRST DAY, THEN DECREASE TO ONE DAILY FOR 4 MORE DAYS.  Dispense: 6 tablet; Refill: 0  2. Upper respiratory tract infection due to COVID-19 virus Patient is still recovering from COVID-19 infection.     Meds ordered this encounter  Medications  . azithromycin (ZITHROMAX) 250 MG tablet    Sig: 2 DAILY FOR FIRST DAY, THEN DECREASE TO ONE DAILY FOR 4 MORE DAYS.    Dispense:  6 tablet    Refill:  0    COVID-19 Education: The signs and symptoms of COVID-19 were discussed with the patient and how to seek care for testing (follow up with PCP or arrange E-visit). The importance of social distancing was discussed today.  Time:   Today, I have spent 12 minutes with the patient with telehealth technology discussing the above problems.    Follow Up:  In Person prn  Signed, Blane Ohara, MD  10/04/2020 7:42 PM    [facility] [place]

## 2020-10-07 ENCOUNTER — Other Ambulatory Visit: Payer: Self-pay

## 2020-10-07 DIAGNOSIS — F900 Attention-deficit hyperactivity disorder, predominantly inattentive type: Secondary | ICD-10-CM

## 2020-10-07 MED ORDER — LISDEXAMFETAMINE DIMESYLATE 70 MG PO CAPS: 70.0000 mg | ORAL_CAPSULE | Freq: Every day | ORAL | 0 refills | Status: DC

## 2020-11-04 ENCOUNTER — Other Ambulatory Visit: Payer: Self-pay

## 2020-11-04 DIAGNOSIS — F900 Attention-deficit hyperactivity disorder, predominantly inattentive type: Secondary | ICD-10-CM

## 2020-11-04 MED ORDER — LISDEXAMFETAMINE DIMESYLATE 70 MG PO CAPS: 70.0000 mg | ORAL_CAPSULE | Freq: Every day | ORAL | 0 refills | Status: DC

## 2020-12-07 ENCOUNTER — Other Ambulatory Visit: Payer: Self-pay

## 2020-12-07 DIAGNOSIS — F900 Attention-deficit hyperactivity disorder, predominantly inattentive type: Secondary | ICD-10-CM

## 2020-12-07 MED ORDER — LISDEXAMFETAMINE DIMESYLATE 70 MG PO CAPS: 70.0000 mg | ORAL_CAPSULE | Freq: Every day | ORAL | 0 refills | Status: DC

## 2020-12-24 ENCOUNTER — Telehealth (INDEPENDENT_AMBULATORY_CARE_PROVIDER_SITE_OTHER): Payer: BC Managed Care – PPO | Admitting: Legal Medicine

## 2020-12-24 ENCOUNTER — Encounter: Payer: Self-pay | Admitting: Family Medicine

## 2020-12-24 DIAGNOSIS — J019 Acute sinusitis, unspecified: Secondary | ICD-10-CM

## 2020-12-24 DIAGNOSIS — J329 Chronic sinusitis, unspecified: Secondary | ICD-10-CM | POA: Insufficient documentation

## 2020-12-24 MED ORDER — AZITHROMYCIN 250 MG PO TABS: ORAL_TABLET | ORAL | 0 refills | Status: DC

## 2020-12-24 NOTE — Progress Notes (Signed)
Virtual Visit via Telephone Note   This visit type was conducted due to national recommendations for restrictions regarding the COVID-19 Pandemic (e.g. social distancing) in an effort to limit this patient's exposure and mitigate transmission in our community.  Due to her co-morbid illnesses, this patient is at least at moderate risk for complications without adequate follow up.  This format is felt to be most appropriate for this patient at this time.  The patient did not have access to video technology/had technical difficulties with video requiring transitioning to audio format only (telephone).  All issues noted in this document were discussed and addressed.  No physical exam could be performed with this format.  Patient verbally consented to a telehealth visit.   Date:  12/24/2020   ID:  Lauren Contreras, DOB [birth year], MRN [id number]  Patient Location: Home Provider Location: Office/Clinic  PCP:  Blane Ohara, MD   Evaluation Performed:  New Patient Evaluation  Chief Complaint:  Dizzy, lose balance for 2 days  History of Present Illness:    Lauren Contreras is a 45 y.o. female with  Dizzy, lose balance for 2 days. No fever or chills.  New hormone. Negative covid. Burning sinus.  Patient is also having problems with her hormone replacement and she is having repeated hot flushes and would like to be put back on her Premarin.  Insurance refused that in the past and but I will let Dr. Sedalia Muta decide on this.  Patient is also having mental health problems especially with school and is unable to cope with all the problems that are going on.  She may require a leave of absence but she will call Dr. Sedalia Muta and see her.  The patient does not have symptoms concerning for COVID-19 infection (fever, chills, cough, or new shortness of breath).    Past Medical History:  Diagnosis Date  . ADHD (attention deficit hyperactivity disorder), inattentive type   . Anxiety   . Major depressive disorder,  single episode with psychotic features (HCC) 01/13/2020  . Migraine without aura, not intractable, without status migrainosus   . Other specified menopausal and perimenopausal disorders     Past Surgical History:  Procedure Laterality Date  . ABDOMINAL HYSTERECTOMY  2014    Family History  Problem Relation Age of Onset  . Hypertension Mother   . COPD Mother   . CAD Father   . Hypertension Father   . Diabetes type II Father   . Bipolar disorder Sister   . Lung cancer Maternal Grandfather   . Seizures Other     Social History   Socioeconomic History  . Marital status: Married    Spouse name: Not on file  . Number of children: 3  . Years of education: Not on file  . Highest education level: Not on file  Occupational History  . Occupation: Runner, broadcasting/film/video  Tobacco Use  . Smoking status: Former Smoker    Quit date: 2012    Years since quitting: 10.0  . Smokeless tobacco: Never Used  Substance and Sexual Activity  . Alcohol use: Never  . Drug use: Never  . Sexual activity: Not on file  Other Topics Concern  . Not on file  Social History Narrative  . Not on file   Social Determinants of Health   Financial Resource Strain: Not on file  Food Insecurity: Not on file  Transportation Needs: Not on file  Physical Activity: Not on file  Stress: Not on file  Social Connections: Not on  file  Intimate Partner Violence: Not on file    Outpatient Medications Prior to Visit  Medication Sig Dispense Refill  . estradiol (ESTRACE) 2 MG tablet TAKE 1 TABLET BY MOUTH EVERY DAY 90 tablet 1  . LamoTRIgine 100 MG TB24 24 hour tablet TAKE 1 TABLET BY MOUTH EVERY DAY 90 tablet 1  . lisdexamfetamine (VYVANSE) 70 MG capsule Take 1 capsule (70 mg total) by mouth daily. 30 capsule 0  . valACYclovir (VALTREX) 500 MG tablet Take 500 mg by mouth 2 (two) times daily. prn    . azithromycin (ZITHROMAX) 250 MG tablet 2 DAILY FOR FIRST DAY, THEN DECREASE TO ONE DAILY FOR 4 MORE DAYS. 6 tablet 0   No  facility-administered medications prior to visit.    Allergies:   Sulfur   Social History   Tobacco Use  . Smoking status: Former Smoker    Quit date: 2012    Years since quitting: 10.0  . Smokeless tobacco: Never Used  Substance Use Topics  . Alcohol use: Never  . Drug use: Never     Review of Systems  Constitutional: Negative for fever.  HENT: Positive for congestion. Negative for sore throat.   Eyes: Negative for redness.  Respiratory: Negative for cough and hemoptysis.   Cardiovascular: Negative for chest pain and claudication.  Gastrointestinal: Positive for heartburn. Negative for melena.  Genitourinary: Negative for dysuria.  Musculoskeletal: Negative for myalgias.  Skin: Negative for itching and rash.  Neurological: Negative.   Psychiatric/Behavioral: Negative.      Labs/Other Tests and Data Reviewed:    Recent Labs: No results found for requested labs within last 8760 hours.   Recent Lipid Panel No results found for: CHOL, TRIG, HDL, CHOLHDL, LDLCALC, LDLDIRECT  Wt Readings from Last 3 Encounters:  12/24/20 152 lb (68.9 kg)  10/01/20 157 lb (71.2 kg)  09/24/20 153 lb (69.4 kg)     Objective:    Vital Signs:  Ht 5' 5.5" (1.664 m)   Wt 152 lb (68.9 kg)   BMI 24.91 kg/m    Physical Exam vs reviewed  ASSESSMENT & PLAN:   Diagnoses and all orders for this visit: Acute non-recurrent sinusitis, unspecified location -     azithromycin (ZITHROMAX) 250 MG tablet; 2 tablets on day 1, then 1 tablet daily on days 2-6. Patient has negative rapid covid yesterday , treat with Zithromax, she is not having veritigo.       COVID-19 Education: The signs and symptoms of COVID-19 were discussed with the patient and how to seek care for testing (follow up with PCP or arrange E-visit). The importance of social distancing was discussed today.   I spent 15 minutes dedicated to the care of this patient on the date of this encounter to include face-to-face time with  the patient, as well as:   Follow Up:  In Person prn  Signed,  Brent Bulla, MD  12/24/2020 11:27 AM    [facility] [place]

## 2020-12-25 ENCOUNTER — Other Ambulatory Visit: Payer: Self-pay

## 2020-12-25 ENCOUNTER — Telehealth: Payer: Self-pay

## 2020-12-25 MED ORDER — ONDANSETRON HCL 4 MG PO TABS: 4.0000 mg | ORAL_TABLET | Freq: Three times a day (TID) | ORAL | 0 refills | Status: DC | PRN

## 2020-12-25 NOTE — Telephone Encounter (Signed)
Lauren Contreras called with continued complaints of vertigo.  She is requesting medication for nausea.  Zofran 4 mg sent to Pharmacy.  She is going to schedule follow-up with Dr. Sedalia Muta in the office.

## 2020-12-29 ENCOUNTER — Telehealth: Payer: Self-pay

## 2020-12-29 NOTE — Telephone Encounter (Signed)
I agree. pt needs to be seen in the office.

## 2020-12-29 NOTE — Telephone Encounter (Signed)
Lauren Contreras called to report that she continues to have vertigo.  She has been taking meclizine for the dizziness and zofran for the nausea with no improvement.  She is questioning the need to recheck in the office.

## 2021-01-08 ENCOUNTER — Other Ambulatory Visit: Payer: Self-pay | Admitting: Family Medicine

## 2021-01-08 ENCOUNTER — Other Ambulatory Visit: Payer: Self-pay

## 2021-01-08 DIAGNOSIS — F900 Attention-deficit hyperactivity disorder, predominantly inattentive type: Secondary | ICD-10-CM

## 2021-01-08 MED ORDER — FLUCONAZOLE 150 MG PO TABS: 150.0000 mg | ORAL_TABLET | Freq: Every day | ORAL | 0 refills | Status: DC

## 2021-01-08 MED ORDER — LISDEXAMFETAMINE DIMESYLATE 70 MG PO CAPS: 70.0000 mg | ORAL_CAPSULE | Freq: Every day | ORAL | 0 refills | Status: DC

## 2021-01-11 ENCOUNTER — Other Ambulatory Visit: Payer: Self-pay | Admitting: Family Medicine

## 2021-02-08 ENCOUNTER — Other Ambulatory Visit: Payer: Self-pay

## 2021-02-08 DIAGNOSIS — F900 Attention-deficit hyperactivity disorder, predominantly inattentive type: Secondary | ICD-10-CM

## 2021-02-08 MED ORDER — LISDEXAMFETAMINE DIMESYLATE 70 MG PO CAPS: 70.0000 mg | ORAL_CAPSULE | Freq: Every day | ORAL | 0 refills | Status: DC

## 2021-02-08 NOTE — Telephone Encounter (Signed)
Pt requesting vyvanse be filled today before insurance ends today.   Lauren Contreras 02/08/21 2:33 PM

## 2021-03-11 ENCOUNTER — Other Ambulatory Visit: Payer: Self-pay

## 2021-03-11 DIAGNOSIS — F900 Attention-deficit hyperactivity disorder, predominantly inattentive type: Secondary | ICD-10-CM

## 2021-03-11 MED ORDER — LISDEXAMFETAMINE DIMESYLATE 70 MG PO CAPS: 70.0000 mg | ORAL_CAPSULE | Freq: Every day | ORAL | 0 refills | Status: DC

## 2021-03-15 ENCOUNTER — Other Ambulatory Visit: Payer: Self-pay

## 2021-03-15 DIAGNOSIS — F900 Attention-deficit hyperactivity disorder, predominantly inattentive type: Secondary | ICD-10-CM

## 2021-03-16 ENCOUNTER — Telehealth: Payer: Self-pay

## 2021-03-16 NOTE — Telephone Encounter (Signed)
PA for Vyvanse submitted and approved via covermymeds.

## 2021-03-19 ENCOUNTER — Other Ambulatory Visit: Payer: Self-pay | Admitting: Physician Assistant

## 2021-03-19 DIAGNOSIS — F900 Attention-deficit hyperactivity disorder, predominantly inattentive type: Secondary | ICD-10-CM

## 2021-03-19 MED ORDER — LISDEXAMFETAMINE DIMESYLATE 70 MG PO CAPS: 70.0000 mg | ORAL_CAPSULE | Freq: Every day | ORAL | 0 refills | Status: DC

## 2021-03-29 ENCOUNTER — Other Ambulatory Visit: Payer: Self-pay | Admitting: Family Medicine

## 2021-04-16 ENCOUNTER — Other Ambulatory Visit: Payer: Self-pay

## 2021-04-16 DIAGNOSIS — F900 Attention-deficit hyperactivity disorder, predominantly inattentive type: Secondary | ICD-10-CM

## 2021-04-16 MED ORDER — LISDEXAMFETAMINE DIMESYLATE 70 MG PO CAPS: 70.0000 mg | ORAL_CAPSULE | Freq: Every day | ORAL | 0 refills | Status: DC

## 2021-04-20 ENCOUNTER — Encounter: Payer: Self-pay | Admitting: Family Medicine

## 2021-04-20 ENCOUNTER — Ambulatory Visit (INDEPENDENT_AMBULATORY_CARE_PROVIDER_SITE_OTHER): Payer: PRIVATE HEALTH INSURANCE | Admitting: Family Medicine

## 2021-04-20 ENCOUNTER — Telehealth: Payer: Self-pay | Admitting: Family Medicine

## 2021-04-20 ENCOUNTER — Other Ambulatory Visit: Payer: Self-pay

## 2021-04-20 VITALS — BP 118/66 | HR 91 | Temp 97.1°F | Ht 65.0 in | Wt 159.0 lb

## 2021-04-20 DIAGNOSIS — F411 Generalized anxiety disorder: Secondary | ICD-10-CM

## 2021-04-20 DIAGNOSIS — Z630 Problems in relationship with spouse or partner: Secondary | ICD-10-CM | POA: Insufficient documentation

## 2021-04-20 DIAGNOSIS — Z8659 Personal history of other mental and behavioral disorders: Secondary | ICD-10-CM | POA: Insufficient documentation

## 2021-04-20 DIAGNOSIS — F902 Attention-deficit hyperactivity disorder, combined type: Secondary | ICD-10-CM

## 2021-04-20 DIAGNOSIS — N958 Other specified menopausal and perimenopausal disorders: Secondary | ICD-10-CM | POA: Diagnosis not present

## 2021-04-20 DIAGNOSIS — K5900 Constipation, unspecified: Secondary | ICD-10-CM | POA: Insufficient documentation

## 2021-04-20 DIAGNOSIS — F4322 Adjustment disorder with anxiety: Secondary | ICD-10-CM | POA: Insufficient documentation

## 2021-04-20 MED ORDER — ESTROGENS CONJUGATED 1.25 MG PO TABS: 2.5000 mg | ORAL_TABLET | Freq: Every day | ORAL | 3 refills | Status: DC

## 2021-04-20 NOTE — Progress Notes (Signed)
Subjective:  Patient ID: Lauren Contreras, female    DOB: 10-30-1976  Age: 45 y.o. MRN: [id number]  Chief Complaint  Patient presents with  . Hormones    HPI Patient was on estradiol 2 mg (> 1 yr) because insurnace would not pay for her premarin. Pt was miserable. She is having hot flashes and emotional/irritable.Tried estradiol patch 1 mg of her mothers, but not working any better.Pt is under less stress, but more emotional. Gained weight on estradiol.  Migraines have been greatly improved. Has tension headaches also. Given ubrelvy sample, but has not needed it.  Has GAD: on lamictal     Current Outpatient Medications on File Prior to Visit  Medication Sig Dispense Refill  . estradiol (ESTRACE) 2 MG tablet TAKE 1 TABLET BY MOUTH EVERY DAY 90 tablet 1  . LamoTRIgine 100 MG TB24 24 hour tablet TAKE 1 TABLET BY MOUTH EVERY DAY 90 tablet 1  . lisdexamfetamine (VYVANSE) 70 MG capsule Take 1 capsule (70 mg total) by mouth daily. 30 capsule 0  . ondansetron (ZOFRAN) 4 MG tablet Take 1 tablet (4 mg total) by mouth every 8 (eight) hours as needed. 20 tablet 0  . valACYclovir (VALTREX) 500 MG tablet Take 500 mg by mouth 2 (two) times daily. prn     No current facility-administered medications on file prior to visit.   Past Medical History:  Diagnosis Date  . ADHD (attention deficit hyperactivity disorder), inattentive type   . Anxiety   . Major depressive disorder, single episode with psychotic features (HCC) 01/13/2020  . Migraine without aura, not intractable, without status migrainosus   . Other specified menopausal and perimenopausal disorders    Past Surgical History:  Procedure Laterality Date  . ABDOMINAL HYSTERECTOMY  2014    Family History  Problem Relation Age of Onset  . Hypertension Mother   . COPD Mother   . CAD Father   . Hypertension Father   . Diabetes type II Father   . Bipolar disorder Sister   . Lung cancer Maternal Grandfather   . Seizures Other    Social  History   Socioeconomic History  . Marital status: Married    Spouse name: Not on file  . Number of children: 3  . Years of education: Not on file  . Highest education level: Not on file  Occupational History  . Occupation: Runner, broadcasting/film/video  Tobacco Use  . Smoking status: Former Smoker    Quit date: 2012    Years since quitting: 10.3  . Smokeless tobacco: Never Used  Substance and Sexual Activity  . Alcohol use: Never  . Drug use: Never  . Sexual activity: Not on file  Other Topics Concern  . Not on file  Social History Narrative  . Not on file   Social Determinants of Health   Financial Resource Strain: Not on file  Food Insecurity: Not on file  Transportation Needs: Not on file  Physical Activity: Not on file  Stress: Not on file  Social Connections: Not on file    Review of Systems  Constitutional: Positive for diaphoresis, fatigue and unexpected weight change. Negative for chills.  HENT: Negative for congestion and sore throat.   Respiratory: Negative for cough and shortness of breath.   Cardiovascular: Negative for chest pain.  Psychiatric/Behavioral: Positive for dysphoric mood (irritable) and sleep disturbance.     Objective:  BP 118/66   Pulse 91   Temp (!) 97.1 F (36.2 C)   Ht 5\' 5"  (1.651  m)   Wt 159 lb (72.1 kg)   SpO2 100%   BMI 26.46 kg/m   BP/Weight 04/20/2021 12/24/2020 10/01/2020  Systolic BP 118 - 124  Diastolic BP 66 - 86  Wt. (Lbs) 159 152 157  BMI 26.46 24.91 26.13    Physical Exam Vitals reviewed.  Constitutional:      Appearance: Normal appearance.  Cardiovascular:     Rate and Rhythm: Normal rate and regular rhythm.     Heart sounds: Normal heart sounds.  Neurological:     Mental Status: She is alert.  Psychiatric:        Mood and Affect: Mood normal.        Behavior: Behavior normal.     Diabetic Foot Exam - Simple   No data filed      No results found for: WBC, HGB, HCT, PLT, GLUCOSE, CHOL, TRIG, HDL, LDLDIRECT, LDLCALC,  ALT, AST, NA, K, CL, CREATININE, BUN, CO2, TSH, PSA, INR, GLUF, HGBA1C, MICROALBUR    Assessment & Plan:   1. Other specified menopausal and perimenopausal disorders Failed on estradiol tablets and patches.  Put back on premarin 1.25 mg 2 daily.   2. Attention deficit hyperactivity disorder (ADHD), combined type Continue vyvanse  3. GAD (generalized anxiety disorder)  Continue lamictal.   Meds ordered this encounter  Medications  . estrogens, conjugated, (PREMARIN) 1.25 MG tablet    Sig: Take 2 tablets (2.5 mg total) by mouth daily.    Dispense:  180 tablet    Refill:  3      Follow-up: Return if symptoms worsen or fail to improve.  An After Visit Summary was printed and given to the patient.  Blane Ohara, MD [facility] [phone]

## 2021-05-18 ENCOUNTER — Other Ambulatory Visit: Payer: Self-pay

## 2021-05-18 DIAGNOSIS — F900 Attention-deficit hyperactivity disorder, predominantly inattentive type: Secondary | ICD-10-CM

## 2021-05-18 MED ORDER — LISDEXAMFETAMINE DIMESYLATE 70 MG PO CAPS: 70.0000 mg | ORAL_CAPSULE | Freq: Every day | ORAL | 0 refills | Status: DC

## 2021-06-08 ENCOUNTER — Other Ambulatory Visit: Payer: Self-pay | Admitting: Family Medicine

## 2021-06-18 ENCOUNTER — Other Ambulatory Visit: Payer: Self-pay

## 2021-06-18 DIAGNOSIS — F900 Attention-deficit hyperactivity disorder, predominantly inattentive type: Secondary | ICD-10-CM

## 2021-06-18 MED ORDER — LISDEXAMFETAMINE DIMESYLATE 70 MG PO CAPS: 70.0000 mg | ORAL_CAPSULE | Freq: Every day | ORAL | 0 refills | Status: DC

## 2021-07-12 ENCOUNTER — Other Ambulatory Visit: Payer: Self-pay | Admitting: Family Medicine

## 2021-07-22 ENCOUNTER — Other Ambulatory Visit: Payer: Self-pay

## 2021-07-22 DIAGNOSIS — F900 Attention-deficit hyperactivity disorder, predominantly inattentive type: Secondary | ICD-10-CM

## 2021-07-22 MED ORDER — LISDEXAMFETAMINE DIMESYLATE 70 MG PO CAPS: 70.0000 mg | ORAL_CAPSULE | Freq: Every day | ORAL | 0 refills | Status: DC

## 2021-07-30 ENCOUNTER — Ambulatory Visit (INDEPENDENT_AMBULATORY_CARE_PROVIDER_SITE_OTHER): Payer: No Typology Code available for payment source | Admitting: Family Medicine

## 2021-07-30 DIAGNOSIS — J069 Acute upper respiratory infection, unspecified: Secondary | ICD-10-CM | POA: Diagnosis not present

## 2021-07-30 DIAGNOSIS — U071 COVID-19: Secondary | ICD-10-CM

## 2021-07-30 LAB — POC COVID19 BINAXNOW: SARS Coronavirus 2 Ag: POSITIVE — AB

## 2021-07-30 MED ORDER — HYDROCODONE BIT-HOMATROP MBR 5-1.5 MG/5ML PO SOLN: 5.0000 mL | Freq: Four times a day (QID) | ORAL | 0 refills | Status: DC | PRN

## 2021-07-30 NOTE — Progress Notes (Signed)
Virtual Visit via Video Note   This visit type was conducted due to national recommendations for restrictions regarding the COVID-19 Pandemic (e.g. social distancing) in an effort to limit this patient's exposure and mitigate transmission in our community.  Due to her co-morbid illnesses, this patient is at least at moderate risk for complications without adequate follow up.  This format is felt to be most appropriate for this patient at this time.  All issues noted in this document were discussed and addressed.  A limited physical exam was performed with this format.  A verbal consent was obtained for the virtual visit.   Date:  08/15/2021   ID:  Lauren Contreras, DOB Apr 14, 1976, MRN [id number]  Patient Location: Home Provider Location: Office/Clinic  PCP:  Blane Ohara, MD   Evaluation Performed:  acute  Chief Complaint:  covid   History of Present Illness:    Lauren Contreras is a 45 y.o. female with covid positive results. Symptoms started 2 days ago. 2 at home test were negative.  The patient does have symptoms concerning for COVID-19 infection , including fever, chills, fatigue, cough, earaches, headaches, congestion and new shortness of breath.    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   Migraine without aura, not intractable, without status migrainosus    Other specified menopausal and perimenopausal disorders     Past Surgical History:  Procedure Laterality Date   ABDOMINAL HYSTERECTOMY  2014    Family History  Problem Relation Age of Onset   Hypertension Mother    COPD Mother    CAD Father    Hypertension Father    Diabetes type II Father    Bipolar disorder Sister    Lung cancer Maternal Grandfather    Seizures Other     Social History   Socioeconomic History   Marital status: Married     Spouse name: Not on file   Number of children: 3   Years of education: Not on file   Highest education level: Not on file  Occupational History   Occupation: Teacher  Tobacco Use   Smoking status: Former    Types: Cigarettes    Quit date: 2012    Years since quitting: 10.6   Smokeless tobacco: Never  Substance and Sexual Activity   Alcohol use: Never   Drug use: Never   Sexual activity: Not on file  Other Topics Concern   Not on file  Social History Narrative   Not on file   Social Determinants of Health   Financial Resource Strain: Not on file  Food Insecurity: Not on file  Transportation Needs: Not on file  Physical Activity: Not on file  Stress: Not on file  Social Connections: Not on file  Intimate Partner Violence: Not on file    Outpatient Medications Prior to Visit  Medication Sig Dispense Refill   estradiol (ESTRACE) 2 MG tablet TAKE 1 TABLET BY MOUTH EVERY DAY 90 tablet 1   estrogens, conjugated, (PREMARIN) 1.25 MG tablet Take 2 tablets (2.5 mg total) by mouth daily. 180 tablet 3   LamoTRIgine 100 MG TB24 24 hour tablet TAKE 1 TABLET BY MOUTH EVERY DAY 90 tablet 1   lisdexamfetamine (VYVANSE) 70 MG capsule Take 1 capsule (70 mg total) by mouth daily. 30 capsule 0   ondansetron (ZOFRAN) 4 MG tablet Take 1 tablet (  4 mg total) by mouth every 8 (eight) hours as needed. 20 tablet 0   valACYclovir (VALTREX) 500 MG tablet TAKE 1 TABLET BY MOUTH TWICE A DAY AS NEEDED 30 tablet 1   No facility-administered medications prior to visit.    Allergies:   Fexofenadine, Other, and Elemental sulfur   Social History   Tobacco Use   Smoking status: Former    Types: Cigarettes    Quit date: 2012    Years since quitting: 10.6   Smokeless tobacco: Never  Substance Use Topics   Alcohol use: Never   Drug use: Never     Review of Systems  Constitutional:  Positive for fever and malaise/fatigue. Negative for chills.  HENT:  Positive for congestion and ear pain. Negative  for sore throat.   Respiratory:  Positive for cough. Negative for shortness of breath.   Cardiovascular:  Negative for chest pain.  Musculoskeletal:        (+) Bodyaches  Neurological:  Positive for headaches.    Labs/Other Tests and Data Reviewed:    Recent Labs: No results found for requested labs within last 8760 hours.   Recent Lipid Panel No results found for: CHOL, TRIG, HDL, CHOLHDL, LDLCALC, LDLDIRECT  Wt Readings from Last 3 Encounters:  04/20/21 159 lb (72.1 kg)  12/24/20 152 lb (68.9 kg)  10/01/20 157 lb (71.2 kg)     Objective:    Vital Signs:  There were no vitals taken for this visit.   Physical Exam  Pt sounds congested ASSESSMENT & PLAN:   1. Upper respiratory tract infection due to COVID-19 virus Rest, fluids, eat 3 meals per day.  Hydromet for cough.  Tylenol or nsaid for aching fever, or headache  2. Lab test positive for detection of COVID-19 virus - POC COVID-19 BinaxNow   Pt recommended to isolate for 5 days and then may return to work with mask for the next 5 days.   Orders Placed This Encounter  Procedures   POC COVID-19 BinaxNow     Meds ordered this encounter  Medications   HYDROcodone bit-homatropine (HYDROMET) 5-1.5 MG/5ML syrup    Sig: Take 5 mLs by mouth every 6 (six) hours as needed for cough.    Dispense:  120 mL    Refill:  0    I spent 10 minutes dedicated to the care of this patient on the date of this encounter to include face-to-face time with the patient.  Follow Up:  In Person prn  Signed, Blane Ohara, MD  08/15/2021 1:22 PM    [facility] [place]

## 2021-08-15 ENCOUNTER — Encounter: Payer: Self-pay | Admitting: Family Medicine

## 2021-08-25 NOTE — Progress Notes (Signed)
Acute Office Visit  Subjective:    Patient ID: Lauren Contreras, female    DOB: July 08, 1976, 45 y.o.   MRN: [id number]  Chief Complaint  Patient presents with   Pedal Edema    Bilateral legs and ankles.    HPI Lauren Contreras is a 45-ear-old Caucasian female that presents for evaluation of bilateral leg edema, weight gain, and fatigue. She tells me she has experienced increased bilateral swelling to feet and legs that worsens as the day progresses. She works at a school. States she walks intermittently . Denies any new medications, past history of heart failure, or thyroid disorder. States she had a hysterectomy several years ago and is currently taking hormone replacement therapy. She speculates her symptoms may be caused by hormone replacement. Lauren Contreras tells me she had COVID-19 in August 2022 and symptoms began afterwards. States she has had COVID-19 twice and was quite ill both times. She added that she experiences mild dyspnea with exertion intermittently.  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   Migraine without aura, not intractable, without status migrainosus    Other specified menopausal and perimenopausal disorders     Past Surgical History:  Procedure Laterality Date   ABDOMINAL HYSTERECTOMY  2014    Family History  Problem Relation Age of Onset   Hypertension Mother    COPD Mother    CAD Father    Hypertension Father    Diabetes type II Father    Bipolar disorder Sister    Lung cancer Maternal Grandfather    Seizures Other     Social History   Socioeconomic History   Marital status: Married    Spouse name: Not on file   Number of children: 3   Years of education: Not on file   Highest education level: Not on file  Occupational History   Occupation: Teacher  Tobacco Use   Smoking status: Former     Types: Cigarettes    Quit date: 2012    Years since quitting: 10.7   Smokeless tobacco: Never  Substance and Sexual Activity   Alcohol use: Never   Drug use: Never   Sexual activity: Not on file  Other Topics Concern   Not on file  Social History Narrative   Not on file   Social Determinants of Health   Financial Resource Strain: Not on file  Food Insecurity: Not on file  Transportation Needs: Not on file  Physical Activity: Not on file  Stress: Not on file  Social Connections: Not on file  Intimate Partner Violence: Not on file    Outpatient Medications Prior to Visit  Medication Sig Dispense Refill   estradiol (ESTRACE) 2 MG tablet TAKE 1 TABLET BY MOUTH EVERY DAY 90 tablet 1   estrogens, conjugated, (PREMARIN) 1.25 MG tablet Take 2 tablets (2.5 mg total) by mouth daily. 180 tablet 3   HYDROcodone bit-homatropine (HYDROMET) 5-1.5 MG/5ML syrup Take 5 mLs by mouth every 6 (six) hours as needed for cough. 120 mL 0   LamoTRIgine 100 MG TB24 24 hour tablet TAKE 1 TABLET BY MOUTH EVERY DAY 90 tablet 1   lisdexamfetamine (VYVANSE) 70 MG capsule Take 1 capsule (70 mg total) by mouth daily. 30 capsule 0   ondansetron (ZOFRAN) 4 MG tablet Take 1 tablet (4 mg total) by mouth every 8 (eight) hours as needed. 20 tablet  0   valACYclovir (VALTREX) 500 MG tablet TAKE 1 TABLET BY MOUTH TWICE A DAY AS NEEDED 30 tablet 1   No facility-administered medications prior to visit.    Allergies  Allergen Reactions   Fexofenadine Other (See Comments)   Other Other (See Comments)   Elemental Sulfur Nausea Only and Rash    Review of Systems  Constitutional:  Positive for fatigue and unexpected weight change. Negative for appetite change and fever.  HENT:  Negative for congestion, ear pain, sinus pressure and sore throat.   Eyes:  Negative for pain.  Respiratory:  Positive for shortness of breath (with exertion). Negative for cough, chest tightness and wheezing.   Cardiovascular:  Positive for  leg swelling. Negative for chest pain and palpitations.  Gastrointestinal:  Negative for abdominal pain, constipation, diarrhea, nausea and vomiting.  Endocrine: Negative.   Genitourinary:  Negative for dysuria and hematuria.  Musculoskeletal:  Negative for arthralgias, back pain, joint swelling and myalgias.  Skin:  Negative for rash.  Allergic/Immunologic: Negative.   Neurological:  Negative for dizziness, weakness and headaches.  Hematological: Negative.   Psychiatric/Behavioral:  Positive for decreased concentration. Negative for dysphoric mood. The patient is not nervous/anxious.       Objective:    Physical Exam Vitals reviewed.  Constitutional:      Appearance: Normal appearance.  Cardiovascular:     Rate and Rhythm: Normal rate and regular rhythm.     Pulses: Normal pulses.     Heart sounds: Normal heart sounds.  Pulmonary:     Effort: Pulmonary effort is normal.     Breath sounds: Normal breath sounds.  Abdominal:     General: Bowel sounds are normal.     Palpations: Abdomen is soft.  Musculoskeletal:        General: Swelling (bilateral ankles and feet) present.  Skin:    General: Skin is warm and dry.     Capillary Refill: Capillary refill takes less than 2 seconds.  Neurological:     General: No focal deficit present.     Mental Status: She is alert and oriented to person, place, and time.  Psychiatric:        Mood and Affect: Mood normal.        Behavior: Behavior normal.        Thought Content: Thought content normal.        Judgment: Judgment normal.    BP 132/88   Pulse 93   Temp 98 F (36.7 C)   Ht 5\' 5"  (1.651 m)   Wt 165 lb 7.2 oz (75 kg)   SpO2 97%   BMI 27.53 kg/m   Wt Readings from Last 3 Encounters:  04/20/21 159 lb (72.1 kg)  12/24/20 152 lb (68.9 kg)  10/01/20 157 lb (71.2 kg)    Health Maintenance Due  Topic Date Due   HIV Screening  Never done   Hepatitis C Screening  Never done   TETANUS/TDAP  Never done   PAP SMEAR-Modifier   Never done   INFLUENZA VACCINE  Never done       Assessment & Plan:   1. Pedal edema - CBC with Differential/Platelet - Comprehensive metabolic panel - TSH - VITAMIN D 25 Hydroxy (Vit-D Deficiency, Fractures)  2. Hormone replacement therapy - CBC with Differential/Platelet - Comprehensive metabolic panel - TSH  3. Weight gain - TSH  4. Other fatigue - CBC with Differential/Platelet - Comprehensive metabolic panel - TSH - VITAMIN D 25 Hydroxy (Vit-D Deficiency, Fractures) -  B12 and Folate Panel  5. History of COVID-19 - CBC with Differential/Platelet - Comprehensive metabolic panel - TSH  6. Dyspnea on exertion - CBC with Differential/Platelet - Comprehensive metabolic panel - TSH     We will call you with lab results Begin Lasix 20 mg daily for edema Recommend compression socks, elevate legs as possible Follow-up in two weeks   I,Lauren M Auman,acting as a scribe for [employer], NP.,have documented all relevant documentation on the behalf of Lauren Morning, NP,as directed by  Lauren Morning, NP while in the presence of Lauren Morning, NP.   I, Lauren Morning, NP, have reviewed all documentation for this visit. The documentation on 08/26/21 for the exam, diagnosis, procedures, and orders are all accurate and complete.    SignedFlonnie Hailstone, DNP 08/26/21 at 10:33 p.m.

## 2021-08-26 ENCOUNTER — Other Ambulatory Visit: Payer: Self-pay

## 2021-08-26 ENCOUNTER — Other Ambulatory Visit: Payer: Self-pay | Admitting: Nurse Practitioner

## 2021-08-26 ENCOUNTER — Encounter: Payer: Self-pay | Admitting: Nurse Practitioner

## 2021-08-26 ENCOUNTER — Ambulatory Visit (INDEPENDENT_AMBULATORY_CARE_PROVIDER_SITE_OTHER): Payer: No Typology Code available for payment source | Admitting: Nurse Practitioner

## 2021-08-26 VITALS — BP 132/88 | HR 93 | Temp 98.0°F | Ht 65.0 in | Wt 165.4 lb

## 2021-08-26 DIAGNOSIS — R5383 Other fatigue: Secondary | ICD-10-CM | POA: Diagnosis not present

## 2021-08-26 DIAGNOSIS — F900 Attention-deficit hyperactivity disorder, predominantly inattentive type: Secondary | ICD-10-CM

## 2021-08-26 DIAGNOSIS — Z8616 Personal history of COVID-19: Secondary | ICD-10-CM

## 2021-08-26 DIAGNOSIS — R0609 Other forms of dyspnea: Secondary | ICD-10-CM

## 2021-08-26 DIAGNOSIS — R635 Abnormal weight gain: Secondary | ICD-10-CM

## 2021-08-26 DIAGNOSIS — R600 Localized edema: Secondary | ICD-10-CM

## 2021-08-26 DIAGNOSIS — Z79890 Hormone replacement therapy (postmenopausal): Secondary | ICD-10-CM | POA: Diagnosis not present

## 2021-08-26 DIAGNOSIS — R0600 Dyspnea, unspecified: Secondary | ICD-10-CM

## 2021-08-26 MED ORDER — LISDEXAMFETAMINE DIMESYLATE 70 MG PO CAPS: 70.0000 mg | ORAL_CAPSULE | Freq: Every day | ORAL | 0 refills | Status: DC

## 2021-08-26 NOTE — Patient Instructions (Addendum)
We will call you with lab results Begin Lasix 20 mg daily for edema Recommend compression socks, elevate legs as possible Follow-up in two weeks   Peripheral Edema Peripheral edema is swelling that is caused by a buildup of fluid. Peripheral edema most often affects the lower legs, ankles, and feet. It can also develop in the arms, hands, and face. The area of the body that has peripheral edema will look swollen. It may also feel heavy or warm. Your clothes may start to feel tight. Pressing on the area may make a temporary dent in your skin. You may not be able to move your swollen arm or leg as much as usual. There are many causes of peripheral edema. It can happen because of a complication of other conditions such as congestive heart failure, kidney disease, or a problem with your blood circulation. It also can be a side effect of certain medicines or because of an infection. It often happens to women during pregnancy. Sometimes, the cause is not known. Follow these instructions at home: Managing pain, stiffness, and swelling  Raise (elevate) your legs while you are sitting or lying down. Move around often to prevent stiffness and to lessen swelling. Do not sit or stand for long periods of time. Wear support stockings as told by your health care provider. Medicines Take over-the-counter and prescription medicines only as told by your health care provider. Your health care provider may prescribe medicine to help your body get rid of excess water (diuretic). General instructions Pay attention to any changes in your symptoms. Follow instructions from your health care provider about limiting salt (sodium) in your diet. Sometimes, eating less salt may reduce swelling. Moisturize skin daily to help prevent skin from cracking and draining. Keep all follow-up visits as told by your health care provider. This is important. Contact a health care provider if you have: A fever. Edema that starts  suddenly or is getting worse, especially if you are pregnant or have a medical condition. Swelling in only one leg. Increased swelling, redness, or pain in one or both of your legs. Drainage or sores at the area where you have edema. Get help right away if you: Develop shortness of breath, especially when you are lying down. Have pain in your chest or abdomen. Feel weak. Feel faint. Summary Peripheral edema is swelling that is caused by a buildup of fluid. Peripheral edema most often affects the lower legs, ankles, and feet. Move around often to prevent stiffness and to lessen swelling. Do not sit or stand for long periods of time. Pay attention to any changes in your symptoms. Contact a health care provider if you have edema that starts suddenly or is getting worse, especially if you are pregnant or have a medical condition. Get help right away if you develop shortness of breath, especially when lying down. This information is not intended to replace advice given to you by your health care provider. Make sure you discuss any questions you have with your health care provider. Document Revised: 08/22/2018 Document Reviewed: 08/22/2018 Elsevier Patient Education  2022 [employer].

## 2021-08-27 LAB — CBC WITH DIFFERENTIAL/PLATELET
Basophils Absolute: 0 10*3/uL (ref 0.0–0.2)
Basos: 0 %
EOS (ABSOLUTE): 0.1 10*3/uL (ref 0.0–0.4)
Eos: 1 %
Hematocrit: 34.6 % (ref 34.0–46.6)
Hemoglobin: 12.1 g/dL (ref 11.1–15.9)
Immature Grans (Abs): 0 10*3/uL (ref 0.0–0.1)
Immature Granulocytes: 0 %
Lymphocytes Absolute: 2.6 10*3/uL (ref 0.7–3.1)
Lymphs: 32 %
MCH: 32.7 pg (ref 26.6–33.0)
MCHC: 35 g/dL (ref 31.5–35.7)
MCV: 94 fL (ref 79–97)
Monocytes Absolute: 0.6 10*3/uL (ref 0.1–0.9)
Monocytes: 8 %
Neutrophils Absolute: 4.7 10*3/uL (ref 1.4–7.0)
Neutrophils: 59 %
Platelets: 384 10*3/uL (ref 150–450)
RBC: 3.7 x10E6/uL — ABNORMAL LOW (ref 3.77–5.28)
RDW: 13 % (ref 11.7–15.4)
WBC: 8.1 10*3/uL (ref 3.4–10.8)

## 2021-08-27 LAB — COMPREHENSIVE METABOLIC PANEL
ALT: 10 IU/L (ref 0–32)
AST: 16 IU/L (ref 0–40)
Albumin/Globulin Ratio: 1.5 (ref 1.2–2.2)
Albumin: 4.1 g/dL (ref 3.8–4.8)
Alkaline Phosphatase: 61 IU/L (ref 44–121)
BUN/Creatinine Ratio: 14 (ref 9–23)
BUN: 11 mg/dL (ref 6–24)
Bilirubin Total: <0.2 mg/dL (ref 0.0–1.2)
CO2: 26 mmol/L (ref 20–29)
Calcium: 9.6 mg/dL (ref 8.7–10.2)
Chloride: 99 mmol/L (ref 96–106)
Creatinine, Ser: 0.77 mg/dL (ref 0.57–1.00)
Globulin, Total: 2.8 g/dL (ref 1.5–4.5)
Glucose: 98 mg/dL (ref 65–99)
Potassium: 4.3 mmol/L (ref 3.5–5.2)
Sodium: 137 mmol/L (ref 134–144)
Total Protein: 6.9 g/dL (ref 6.0–8.5)
eGFR: 98 mL/min/{1.73_m2} (ref >59)

## 2021-08-27 LAB — TSH: TSH: 3.23 u[IU]/mL (ref 0.450–4.500)

## 2021-08-27 LAB — VITAMIN D 25 HYDROXY (VIT D DEFICIENCY, FRACTURES): Vit D, 25-Hydroxy: 39.5 ng/mL (ref 30.0–100.0)

## 2021-08-27 LAB — B12 AND FOLATE PANEL
Folate: >20 ng/mL (ref >3.0)
Vitamin B-12: 442 pg/mL (ref 232–1245)

## 2021-09-09 ENCOUNTER — Ambulatory Visit (INDEPENDENT_AMBULATORY_CARE_PROVIDER_SITE_OTHER): Payer: No Typology Code available for payment source | Admitting: Nurse Practitioner

## 2021-09-09 ENCOUNTER — Encounter: Payer: Self-pay | Admitting: Nurse Practitioner

## 2021-09-09 ENCOUNTER — Other Ambulatory Visit: Payer: Self-pay

## 2021-09-09 VITALS — BP 118/82 | HR 86 | Temp 97.4°F | Ht 65.0 in | Wt 161.0 lb

## 2021-09-09 DIAGNOSIS — Z79890 Hormone replacement therapy (postmenopausal): Secondary | ICD-10-CM | POA: Diagnosis not present

## 2021-09-09 DIAGNOSIS — E8940 Asymptomatic postprocedural ovarian failure: Secondary | ICD-10-CM | POA: Diagnosis not present

## 2021-09-09 DIAGNOSIS — Z1231 Encounter for screening mammogram for malignant neoplasm of breast: Secondary | ICD-10-CM | POA: Diagnosis not present

## 2021-09-09 MED ORDER — ESTROGENS CONJUGATED 0.625 MG/GM VA CREA: TOPICAL_CREAM | VAGINAL | 12 refills | Status: DC

## 2021-09-09 NOTE — Patient Instructions (Addendum)
Taper Premarin 2.5 mg oral medication, take one tablet 1.25 mg daily for 7 days, then take one half tablet for 7 days,then take one half tablet every other day and then stop Begin Premarin vaginal cream nightly for two weeks then decrease to two nights per week (Sunday, Wednesday) We call you for mammogram appt for Oct 24th, 2022 Follow-up in 72-month   Menopause and Hormone Replacement Therapy Menopause is a normal time of life when menstrual periods stop completely and the ovaries stop producing the female hormones estrogen and progesterone. Low levels of these hormones can affect your health and cause symptoms. Hormone replacement therapy (HRT) can relieve some of those symptoms. HRT is the use of artificial (synthetic) hormones to replace hormones that your body has stopped producing because you have reached menopause. Types of HRT HRT may consist of the synthetic hormones estrogen and progestin, or it may consist of estrogen-only therapy. You and your health care provider will decide which form of HRT is best for you. If you choose to be on HRT and you have a uterus, estrogen and progestin are usually prescribed. Estrogen-only therapy is used for women who do not have a uterus. Possible options for taking HRT include: Pills. Patches. Gels. Sprays. Vaginal cream. Vaginal rings. Vaginal inserts. The amount of hormones that you take and how long you take them varies according to your health. It is important to: Begin HRT with the lowest possible dosage. Stop HRT as soon as your health care provider tells you to stop. Work with your health care provider so that you feel informed and comfortable with your decisions. Tell a health care provider about: Any allergies you have. Whether you have had blood clots or know of any risk factors you may have for blood clots. Whether you or family members have had cancer, especially cancer of the breasts, ovaries, or uterus. Any surgeries you have  had. All medicines you are taking, including vitamins, herbs, eye drops, creams, and over-the-counter medicines. Whether you are pregnant or may be pregnant. Any medical conditions you have. What are the benefits? HRT can reduce the frequency and severity of menopausal symptoms. Benefits of HRT vary according to the kind of symptoms that you have, how severe they are, and your overall health. HRT may help to improve the following symptoms of menopause: Hot flashes and night sweats. These are sudden feelings of heat that spread over the face and body. The skin may turn red, like a blush. Night sweats are hot flashes that happen while you are sleeping or trying to sleep. Bone loss (osteoporosis). The body loses calcium more quickly after menopause, causing the bones to become weaker. This can increase the risk for bone breaks (fractures). Vaginal dryness. The lining of the vagina can become thin and dry, which can cause pain during sex or cause infection, burning, or itching. Urinary tract infections. Urinary incontinence. This is the inability to control when you urinate. Irritability. Short-term memory problems. What are the risks? Risks of HRT vary depending on your individual health and medical history. Risks of HRT also depend on whether you receive both estrogen and progestin or you receive estrogen only. HRT may increase the risk of: Spotting. This is when a small amount of blood leaks from the vagina unexpectedly. Endometrial cancer. This cancer is in the lining of the uterus (endometrium). Breast cancer. Increased density of breast tissue. This can make it harder to find breast cancer on a breast X-ray (mammogram). Stroke. Heart disease. Blood clots.  Gallbladder disease or liver disease. Risks of HRT can increase if you have any of the following conditions: Endometrial cancer. Liver disease. Heart disease. Breast cancer. History of blood clots. History of stroke. Follow these  instructions at home: Pap tests Have Pap tests done as often as told by your health care provider. A Pap test is sometimes called a Pap smear. It is a screening test that is used to check for signs of cancer of the cervix and vagina. A Pap test can also identify the presence of infection or precancerous changes. Pap tests may be done: Every 3 years, starting at age 48. Every 5 years, starting after age 39, in combination with testing for human papillomavirus (HPV). More often or less often depending on other medical conditions you have, your age, and other risk factors. It is up to you to get the results of your Pap test. Ask your health care provider, or the department that is doing the test, when your results will be ready. General instructions Take over-the-counter and prescription medicines only as told by your health care provider. Do not use any products that contain nicotine or tobacco. These products include cigarettes, chewing tobacco, and vaping devices, such as e-cigarettes. If you need help quitting, ask your health care provider. Get mammograms, pelvic exams, and medical checkups as often as told by your health care provider. Keep all follow-up visits. This is important. Contact a health care provider if you have: Pain or swelling in your legs. Lumps or changes in your breasts or armpits. Pain, burning, or bleeding when you urinate. Unusual vaginal bleeding. Dizziness or headaches. Pain in your abdomen. Get help right away if you have: Shortness of breath. Chest pain. Slurred speech. Weakness or numbness in any part of your arms or legs. These symptoms may represent a serious problem that is an emergency. Do not wait to see if the symptoms will go away. Get medical help right away. Call your local emergency services (911 in the [place] Do not drive yourself to the hospital. Summary Menopause is a normal time of life when menstrual periods stop completely and the ovaries stop  producing the female hormones estrogen and progesterone. HRT can reduce the frequency and severity of menopausal symptoms. Risks of HRT vary depending on your individual health and medical history. This information is not intended to replace advice given to you by your health care provider. Make sure you discuss any questions you have with your health care provider. Document Revised: 06/01/2020 Document Reviewed: 06/01/2020 Elsevier Patient Education  2022 [employer].

## 2021-09-09 NOTE — Progress Notes (Signed)
Established Patient Office Visit  Subjective:  Patient ID: Lauren Contreras, female    DOB: 1976/02/15  Age: 45 y.o. MRN: [id number]  CC: Pedal edema  HPI Lauren Contreras presents for follow-up of pedal edema and unplanned weight gain. States bilateral leg edema has improved. She has lost 4-pounds in 2-weeks. Grover is concerned with risk of systemic estrogen replacement. States she would like to taper off medication and discuss alternatives.   Past Medical History:  Diagnosis Date   ADHD (attention deficit hyperactivity disorder), inattentive type    Anxiety    Major depressive disorder, single episode with psychotic features ([place]) 01/13/2020   Migraine without aura, not intractable, without status migrainosus    Other specified menopausal and perimenopausal disorders     Past Surgical History:  Procedure Laterality Date   ABDOMINAL HYSTERECTOMY  2014    Family History  Problem Relation Age of Onset   Hypertension Mother    COPD Mother    CAD Father    Hypertension Father    Diabetes type II Father    Bipolar disorder Sister    Lung cancer Maternal Grandfather    Seizures Other     Social History   Socioeconomic History   Marital status: Married    Spouse name: Not on file   Number of children: 3   Years of education: Not on file   Highest education level: Not on file  Occupational History   Occupation: Teacher  Tobacco Use   Smoking status: Former    Types: Cigarettes    Quit date: 2012    Years since quitting: 10.7   Smokeless tobacco: Never  Substance and Sexual Activity   Alcohol use: Never   Drug use: Never   Sexual activity: Not on file  Other Topics Concern   Not on file  Social History Narrative   Not on file   Social Determinants of Health   Financial Resource Strain: Not on file  Food Insecurity: Not on file  Transportation Needs: Not on file  Physical Activity: Not on file  Stress: Not on file  Social Connections: Not on file   Intimate Partner Violence: Not on file    Outpatient Medications Prior to Visit  Medication Sig Dispense Refill   estrogens, conjugated, (PREMARIN) 1.25 MG tablet Take 2 tablets (2.5 mg total) by mouth daily. 180 tablet 3   LamoTRIgine 100 MG TB24 24 hour tablet TAKE 1 TABLET BY MOUTH EVERY DAY 90 tablet 1   lisdexamfetamine (VYVANSE) 70 MG capsule Take 1 capsule (70 mg total) by mouth daily. 30 capsule 0   ondansetron (ZOFRAN) 4 MG tablet Take 1 tablet (4 mg total) by mouth every 8 (eight) hours as needed. 20 tablet 0   valACYclovir (VALTREX) 500 MG tablet TAKE 1 TABLET BY MOUTH TWICE A DAY AS NEEDED 30 tablet 1   HYDROcodone bit-homatropine (HYDROMET) 5-1.5 MG/5ML syrup Take 5 mLs by mouth every 6 (six) hours as needed for cough. 120 mL 0   No facility-administered medications prior to visit.    Allergies  Allergen Reactions   Fexofenadine Other (See Comments)   Other Other (See Comments)   Elemental Sulfur Nausea Only and Rash    ROS Review of Systems  Constitutional:  Negative for appetite change, fatigue and unexpected weight change.  HENT:  Negative for congestion, ear pain, rhinorrhea, sinus pressure, sinus pain and tinnitus.   Eyes:  Negative for pain.  Respiratory:  Negative for cough and shortness of breath.  Cardiovascular:  Negative for chest pain, palpitations and leg swelling.  Gastrointestinal:  Negative for abdominal pain, constipation, diarrhea, nausea and vomiting.  Endocrine: Negative for cold intolerance, heat intolerance, polydipsia, polyphagia and polyuria.  Genitourinary:  Negative for dysuria, frequency and hematuria.  Musculoskeletal:  Negative for arthralgias, back pain, joint swelling and myalgias.  Skin:  Negative for rash.  Allergic/Immunologic: Negative for environmental allergies.  Neurological:  Negative for dizziness and headaches.  Hematological:  Negative for adenopathy.  Psychiatric/Behavioral:  Negative for decreased concentration and sleep  disturbance. The patient is not nervous/anxious.      Objective:    Physical Exam Constitutional:      Appearance: Normal appearance.  HENT:     Head: Normocephalic.  Neck:     Vascular: No carotid bruit.  Abdominal:     Tenderness: There is no abdominal tenderness. There is no guarding.  Musculoskeletal:        General: No swelling.  Skin:    General: Skin is warm and dry.     Capillary Refill: Capillary refill takes less than 2 seconds.  Neurological:     General: No focal deficit present.     Mental Status: She is alert and oriented to person, place, and time.  Psychiatric:        Mood and Affect: Mood normal.        Behavior: Behavior normal.   BP 118/82 (BP Location: Left Arm, Patient Position: Sitting)   Pulse 86   Temp (!) 97.4 F (36.3 C) (Temporal)   Ht _0  (1.651 m)   Wt 161 lb (73 kg)   SpO2 99%   BMI 26.79 kg/m  Wt Readings from Last 3 Encounters:  09/09/21 161 lb (73 kg)  08/26/21 165 lb 7.2 oz (75 kg)  04/20/21 159 lb (72.1 kg)     Health Maintenance Due  Topic Date Due   HIV Screening  Never done   Hepatitis C Screening  Never done   TETANUS/TDAP  Never done   PAP SMEAR-Modifier  Never done   INFLUENZA VACCINE  Never done    Lab Results  Component Value Date   TSH 3.230 08/26/2021   Lab Results  Component Value Date   WBC 8.1 08/26/2021   HGB 12.1 08/26/2021   HCT 34.6 08/26/2021   MCV 94 08/26/2021   PLT 384 08/26/2021   Lab Results  Component Value Date   NA 137 08/26/2021   K 4.3 08/26/2021   CO2 26 08/26/2021   GLUCOSE 98 08/26/2021   BUN 11 08/26/2021   CREATININE 0.77 08/26/2021   BILITOT <0.2 08/26/2021   ALKPHOS 61 08/26/2021   AST 16 08/26/2021   ALT 10 08/26/2021   PROT 6.9 08/26/2021   ALBUMIN 4.1 08/26/2021   CALCIUM 9.6 08/26/2021   EGFR 98 08/26/2021        Assessment & Plan:    1. Surgical menopause on hormone replacement therapy- -taper oral Premarin 2.50 mg as directed -begin Premarin vaginal  cream as directed  2. Hormone replacement therapy - conjugated estrogens (PREMARIN) vaginal cream; Apply nightly to vaginal area for two weeks then decrease to two nights weekly (Sunday/Wednesday)  Dispense: 42.5 g; Refill: 12  3. Encounter for screening mammogram for malignant neoplasm of breast - MM DIGITAL SCREENING BILATERAL    Taper Premarin 2.5 mg oral medication, take one tablet 1.25 mg daily for 7 days, then take one half tablet for 7 days,then take one half tablet every other day and then  stop Begin Premarin vaginal cream nightly for two weeks then decrease to two nights per week (Sunday, Wednesday) We call you for mammogram appt for Oct 24th, 2022 Follow-up in 42-month   Follow-up: 10/04/21    I,Lauren M Auman,acting as a scribe for SRip Harbour NP.,have documented all relevant documentation on the behalf of SRip Harbour NP,as directed by  SRip Harbour NP while in the presence of SRip Harbour NP.   I, SRip Harbour NP, have reviewed all documentation for this visit. The documentation on 09/09/21 for the exam, diagnosis, procedures, and orders are all accurate and complete.    Signed,Jerrell Belfast DNP 09/09/21 at 11:24 PM

## 2021-09-17 ENCOUNTER — Telehealth: Payer: Self-pay

## 2021-09-17 NOTE — Telephone Encounter (Signed)
Patient has been scheduled for mobile mammogram bus on October 24th at 3:10 because patient is also scheduled to see Flonnie Hailstone DNP on same day and needs enough time to get done with Mammogram then come to her appt. Left detailed message informing patient of date time and place of mammogram and told her if she has any questions or concerns to give [place] a call back.

## 2021-09-20 ENCOUNTER — Other Ambulatory Visit: Payer: Self-pay | Admitting: Nurse Practitioner

## 2021-09-20 DIAGNOSIS — Z1231 Encounter for screening mammogram for malignant neoplasm of breast: Secondary | ICD-10-CM

## 2021-09-27 ENCOUNTER — Other Ambulatory Visit: Payer: Self-pay

## 2021-09-27 DIAGNOSIS — F900 Attention-deficit hyperactivity disorder, predominantly inattentive type: Secondary | ICD-10-CM

## 2021-09-27 MED ORDER — LISDEXAMFETAMINE DIMESYLATE 70 MG PO CAPS: 70.0000 mg | ORAL_CAPSULE | Freq: Every day | ORAL | 0 refills | Status: DC

## 2021-10-04 ENCOUNTER — Ambulatory Visit (INDEPENDENT_AMBULATORY_CARE_PROVIDER_SITE_OTHER): Payer: No Typology Code available for payment source | Admitting: Nurse Practitioner

## 2021-10-04 ENCOUNTER — Encounter: Payer: Self-pay | Admitting: Nurse Practitioner

## 2021-10-04 ENCOUNTER — Other Ambulatory Visit: Payer: Self-pay

## 2021-10-04 ENCOUNTER — Ambulatory Visit: Admission: RE | Admit: 2021-10-04 | Payer: No Typology Code available for payment source | Source: Ambulatory Visit

## 2021-10-04 VITALS — BP 120/76 | HR 98 | Temp 97.3°F | Ht 65.0 in | Wt 161.0 lb

## 2021-10-04 DIAGNOSIS — E8940 Asymptomatic postprocedural ovarian failure: Secondary | ICD-10-CM

## 2021-10-04 DIAGNOSIS — R110 Nausea: Secondary | ICD-10-CM

## 2021-10-04 DIAGNOSIS — Z79890 Hormone replacement therapy (postmenopausal): Secondary | ICD-10-CM

## 2021-10-04 DIAGNOSIS — Z1231 Encounter for screening mammogram for malignant neoplasm of breast: Secondary | ICD-10-CM

## 2021-10-04 DIAGNOSIS — R600 Localized edema: Secondary | ICD-10-CM | POA: Diagnosis not present

## 2021-10-04 MED ORDER — FUROSEMIDE 20 MG PO TABS: 20.0000 mg | ORAL_TABLET | Freq: Every day | ORAL | 3 refills | Status: AC | PRN

## 2021-10-04 MED ORDER — ONDANSETRON 4 MG PO TBDP: 4.0000 mg | ORAL_TABLET | Freq: Three times a day (TID) | ORAL | 1 refills | Status: DC | PRN

## 2021-10-04 MED ORDER — ESTROGENS CONJUGATED 1.25 MG PO TABS: 1.2500 mg | ORAL_TABLET | Freq: Every day | ORAL | 1 refills | Status: DC

## 2021-10-04 NOTE — Patient Instructions (Addendum)
Continue medications as prescribed Take Lasix 20 mg as needed for swelling We will call you with with mammogram appointment, Dec 27th, 2022 Follow-up 81-months    Peripheral Edema Peripheral edema is swelling that is caused by a buildup of fluid. Peripheral edema most often affects the lower legs, ankles, and feet. It can also develop in the arms, hands, and face. The area of the body that has peripheral edema will look swollen. It may also feel heavy or warm. Your clothes may start to feel tight. Pressing on the area may make a temporary dent in your skin. You may not be able to move your swollen arm or leg as much as usual. There are many causes of peripheral edema. It can happen because of a complication of other conditions such as congestive heart failure, kidney disease, or a problem with your blood circulation. It also can be a side effect of certain medicines or because of an infection. It often happens to women during pregnancy. Sometimes, the cause is not known. Follow these instructions at home: Managing pain, stiffness, and swelling  Raise (elevate) your legs while you are sitting or lying down. Move around often to prevent stiffness and to lessen swelling. Do not sit or stand for long periods of time. Wear support stockings as told by your health care provider. Medicines Take over-the-counter and prescription medicines only as told by your health care provider. Your health care provider may prescribe medicine to help your body get rid of excess water (diuretic). General instructions Pay attention to any changes in your symptoms. Follow instructions from your health care provider about limiting salt (sodium) in your diet. Sometimes, eating less salt may reduce swelling. Moisturize skin daily to help prevent skin from cracking and draining. Keep all follow-up visits as told by your health care provider. This is important. Contact a health care provider if you have: A fever. Edema  that starts suddenly or is getting worse, especially if you are pregnant or have a medical condition. Swelling in only one leg. Increased swelling, redness, or pain in one or both of your legs. Drainage or sores at the area where you have edema. Get help right away if you: Develop shortness of breath, especially when you are lying down. Have pain in your chest or abdomen. Feel weak. Feel faint. Summary Peripheral edema is swelling that is caused by a buildup of fluid. Peripheral edema most often affects the lower legs, ankles, and feet. Move around often to prevent stiffness and to lessen swelling. Do not sit or stand for long periods of time. Pay attention to any changes in your symptoms. Contact a health care provider if you have edema that starts suddenly or is getting worse, especially if you are pregnant or have a medical condition. Get help right away if you develop shortness of breath, especially when lying down. This information is not intended to replace advice given to you by your health care provider. Make sure you discuss any questions you have with your health care provider. Document Revised: 08/22/2018 Document Reviewed: 08/22/2018 Elsevier Patient Education  2022 [employer]    Menopause and Hormone Replacement Therapy Menopause is a normal time of life when menstrual periods stop completely and the ovaries stop producing the female hormones estrogen and progesterone. Low levels of these hormones can affect your health and cause symptoms. Hormone replacement therapy (HRT) can relieve some of those symptoms. HRT is the use of artificial (synthetic) hormones to replace hormones that your body has  stopped producing because you have reached menopause. Types of HRT HRT may consist of the synthetic hormones estrogen and progestin, or it may consist of estrogen-only therapy. You and your health care provider will decide which form of HRT is best for you. If you choose to be on  HRT and you have a uterus, estrogen and progestin are usually prescribed. Estrogen-only therapy is used for women who do not have a uterus. Possible options for taking HRT include: Pills. Patches. Gels. Sprays. Vaginal cream. Vaginal rings. Vaginal inserts. The amount of hormones that you take and how long you take them varies according to your health. It is important to: Begin HRT with the lowest possible dosage. Stop HRT as soon as your health care provider tells you to stop. Work with your health care provider so that you feel informed and comfortable with your decisions. Tell a health care provider about: Any allergies you have. Whether you have had blood clots or know of any risk factors you may have for blood clots. Whether you or family members have had cancer, especially cancer of the breasts, ovaries, or uterus. Any surgeries you have had. All medicines you are taking, including vitamins, herbs, eye drops, creams, and over-the-counter medicines. Whether you are pregnant or may be pregnant. Any medical conditions you have. What are the benefits? HRT can reduce the frequency and severity of menopausal symptoms. Benefits of HRT vary according to the kind of symptoms that you have, how severe they are, and your overall health. HRT may help to improve the following symptoms of menopause: Hot flashes and night sweats. These are sudden feelings of heat that spread over the face and body. The skin may turn red, like a blush. Night sweats are hot flashes that happen while you are sleeping or trying to sleep. Bone loss (osteoporosis). The body loses calcium more quickly after menopause, causing the bones to become weaker. This can increase the risk for bone breaks (fractures). Vaginal dryness. The lining of the vagina can become thin and dry, which can cause pain during sex or cause infection, burning, or itching. Urinary tract infections. Urinary incontinence. This is the inability to  control when you urinate. Irritability. Short-term memory problems. What are the risks? Risks of HRT vary depending on your individual health and medical history. Risks of HRT also depend on whether you receive both estrogen and progestin or you receive estrogen only. HRT may increase the risk of: Spotting. This is when a small amount of blood leaks from the vagina unexpectedly. Endometrial cancer. This cancer is in the lining of the uterus (endometrium). Breast cancer. Increased density of breast tissue. This can make it harder to find breast cancer on a breast X-ray (mammogram). Stroke. Heart disease. Blood clots. Gallbladder disease or liver disease. Risks of HRT can increase if you have any of the following conditions: Endometrial cancer. Liver disease. Heart disease. Breast cancer. History of blood clots. History of stroke. Follow these instructions at home: Pap tests Have Pap tests done as often as told by your health care provider. A Pap test is sometimes called a Pap smear. It is a screening test that is used to check for signs of cancer of the cervix and vagina. A Pap test can also identify the presence of infection or precancerous changes. Pap tests may be done: Every 3 years, starting at age 69. Every 5 years, starting after age 79, in combination with testing for human papillomavirus (HPV). More often or less often depending on other  medical conditions you have, your age, and other risk factors. It is up to you to get the results of your Pap test. Ask your health care provider, or the department that is doing the test, when your results will be ready. General instructions Take over-the-counter and prescription medicines only as told by your health care provider. Do not use any products that contain nicotine or tobacco. These products include cigarettes, chewing tobacco, and vaping devices, such as e-cigarettes. If you need help quitting, ask your health care provider. Get  mammograms, pelvic exams, and medical checkups as often as told by your health care provider. Keep all follow-up visits. This is important. Contact a health care provider if you have: Pain or swelling in your legs. Lumps or changes in your breasts or armpits. Pain, burning, or bleeding when you urinate. Unusual vaginal bleeding. Dizziness or headaches. Pain in your abdomen. Get help right away if you have: Shortness of breath. Chest pain. Slurred speech. Weakness or numbness in any part of your arms or legs. These symptoms may represent a serious problem that is an emergency. Do not wait to see if the symptoms will go away. Get medical help right away. Call your local emergency services (911 in the [place] Do not drive yourself to the hospital. Summary Menopause is a normal time of life when menstrual periods stop completely and the ovaries stop producing the female hormones estrogen and progesterone. HRT can reduce the frequency and severity of menopausal symptoms. Risks of HRT vary depending on your individual health and medical history. This information is not intended to replace advice given to you by your health care provider. Make sure you discuss any questions you have with your health care provider. Document Revised: 06/01/2020 Document Reviewed: 06/01/2020 Elsevier Patient Education  2022 [employer].

## 2021-10-04 NOTE — Progress Notes (Signed)
Subjective:  Patient ID: Lauren Contreras, female    DOB: [birth year]  Age: 45 y.o. MRN: [id number]  Chief Complaint  Patient presents with   Follow-up    HPI  Lauren Contreras is a 45 year old Caucasian female that presents follow-up of pedal edema and unexpected weight gain. She was prescribed Lasix 20 mg PRN, however was unable to pick it up from her pharmacy due to a technical issue.   [place] had requested to discontinue oral Premarin due to concerns with prolonged use and cancer risks. Premarin vaginal cream was prescribed during oral Premarin taper. She tells me that she developed cystic acne on her face and nausea. She  was unable to tolerate side effects. She resumed oral Premarin. She works full-time at [employer]. States she has experienced increased stress at her job due to mid-term exams. Khylah tells me she would like to attempt a Premarin taper in the summer when she has less stress.    Current Outpatient Medications on File Prior to Visit  Medication Sig Dispense Refill   conjugated estrogens (PREMARIN) vaginal cream Apply nightly to vaginal area for two weeks then decrease to two nights weekly (Sunday/Wednesday) 42.5 g 12   LamoTRIgine 100 MG TB24 24 hour tablet TAKE 1 TABLET BY MOUTH EVERY DAY 90 tablet 1   lisdexamfetamine (VYVANSE) 70 MG capsule Take 1 capsule (70 mg total) by mouth daily. 30 capsule 0   ondansetron (ZOFRAN) 4 MG tablet Take 1 tablet (4 mg total) by mouth every 8 (eight) hours as needed. 20 tablet 0   valACYclovir (VALTREX) 500 MG tablet TAKE 1 TABLET BY MOUTH TWICE A DAY AS NEEDED 30 tablet 1   No current facility-administered medications on file prior to visit.   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   Migraine without aura, not intractable, without status migrainosus    Other  specified menopausal and perimenopausal disorders    Past Surgical History:  Procedure Laterality Date   ABDOMINAL HYSTERECTOMY  2014    Family History  Problem Relation Age of Onset   Hypertension Mother    COPD Mother    CAD Father    Hypertension Father    Diabetes type II Father    Bipolar disorder Sister    Lung cancer Maternal Grandfather    Seizures Other    Social History   Socioeconomic History   Marital status: Married    Spouse name: Not on file   Number of children: 3   Years of education: Not on file   Highest education level: Not on file  Occupational History   Occupation: Teacher  Tobacco Use   Smoking status: Former    Types: Cigarettes    Quit date: 2012    Years since quitting: 10.8   Smokeless tobacco: Never  Substance and Sexual Activity   Alcohol use: Never   Drug use: Never   Sexual activity: Not on file  Other Topics Concern   Not on file  Social History Narrative   Not on file   Social Determinants of Health   Financial Resource Strain: Not on file  Food Insecurity: Not on file  Transportation Needs: Not on file  Physical Activity: Not on file  Stress: Not on file  Social Connections: Not on file    Review of Systems  Constitutional:  Negative for fatigue.  HENT:  Negative for congestion, ear pain and sore throat.   Respiratory:  Negative for cough and shortness of breath.   Cardiovascular:  Negative for chest pain.  Gastrointestinal:  Negative for abdominal pain, constipation, diarrhea, nausea and vomiting.  Genitourinary:  Negative for dysuria, frequency and urgency.  Musculoskeletal:  Negative for arthralgias, back pain and myalgias.  Neurological:  Negative for dizziness and headaches.  Psychiatric/Behavioral:  Negative for agitation and sleep disturbance. The patient is not nervous/anxious.     Objective:  BP 120/76 (BP Location: Left Arm, Patient Position: Sitting, Cuff Size: Normal)   Pulse 98   Temp (!) 97.3 F (36.3  C) (Temporal)   Ht 5\' 5"  (1.651 m)   Wt 161 lb (73 kg)   SpO2 98%   BMI 26.79 kg/m   BP/Weight 10/04/2021 09/09/2021 08/26/2021  Systolic BP 120 118 132  Diastolic BP 76 82 88  Wt. (Lbs) 161 161 165.45  BMI 26.79 26.79 27.53    Physical Exam Vitals reviewed.  Constitutional:      Appearance: Normal appearance.  Skin:    General: Skin is warm and dry.     Capillary Refill: Capillary refill takes less than 2 seconds.  Neurological:     General: No focal deficit present.     Mental Status: She is alert and oriented to person, place, and time.  Psychiatric:        Mood and Affect: Mood normal.        Behavior: Behavior normal.        Lab Results  Component Value Date   WBC 8.1 08/26/2021   HGB 12.1 08/26/2021   HCT 34.6 08/26/2021   PLT 384 08/26/2021   GLUCOSE 98 08/26/2021   ALT 10 08/26/2021   AST 16 08/26/2021   NA 137 08/26/2021   K 4.3 08/26/2021   CL 99 08/26/2021   CREATININE 0.77 08/26/2021   BUN 11 08/26/2021   CO2 26 08/26/2021   TSH 3.230 08/26/2021      Assessment & Plan:   . 1. Pedal edema - furosemide (LASIX) 20 MG tablet; Take 1 tablet (20 mg total) by mouth daily as needed.  Dispense: 90 tablet; Refill: 3  2. Nausea - ondansetron (ZOFRAN ODT) 4 MG disintegrating tablet; Take 1 tablet (4 mg total) by mouth every 8 (eight) hours as needed for nausea or vomiting.  Dispense: 30 tablet; Refill: 1  3. Surgical menopause on hormone replacement therapy - estrogens, conjugated, (PREMARIN) 1.25 MG tablet; Take 1 tablet (1.25 mg total) by mouth daily.  Dispense: 90 tablet; Refill: 1  4. Encounter for screening mammogram for malignant neoplasm of breast - MM DIGITAL SCREENING BILATERAL   Continue medications as prescribed Take Lasix 20 mg as needed for swelling We will call you with with mammogram appointment, Dec 27th, 2022 Follow-up 71-months    Follow-up: 38-months  An After Visit Summary was printed and given to the patient.  I, 2-month, NP, have reviewed all documentation for this visit. The documentation on 10/04/21 for the exam, diagnosis, procedures, and orders are all accurate and complete.     Signed, 10/06/21, NP [facility] [phone]

## 2021-10-28 ENCOUNTER — Other Ambulatory Visit: Payer: Self-pay

## 2021-10-28 DIAGNOSIS — F900 Attention-deficit hyperactivity disorder, predominantly inattentive type: Secondary | ICD-10-CM

## 2021-10-28 MED ORDER — LISDEXAMFETAMINE DIMESYLATE 70 MG PO CAPS: 70.0000 mg | ORAL_CAPSULE | Freq: Every day | ORAL | 0 refills | Status: DC

## 2021-11-29 ENCOUNTER — Other Ambulatory Visit: Payer: Self-pay

## 2021-11-29 DIAGNOSIS — F900 Attention-deficit hyperactivity disorder, predominantly inattentive type: Secondary | ICD-10-CM

## 2021-11-29 MED ORDER — LISDEXAMFETAMINE DIMESYLATE 70 MG PO CAPS: 70.0000 mg | ORAL_CAPSULE | Freq: Every day | ORAL | 0 refills | Status: DC

## 2021-11-29 NOTE — Telephone Encounter (Signed)
Patient called following up on refill request from last week.  She is out of Vyvanse and is requesting refill sent to [facility] [address]

## 2021-12-29 ENCOUNTER — Other Ambulatory Visit: Payer: Self-pay

## 2021-12-29 DIAGNOSIS — F900 Attention-deficit hyperactivity disorder, predominantly inattentive type: Secondary | ICD-10-CM

## 2021-12-29 MED ORDER — LISDEXAMFETAMINE DIMESYLATE 70 MG PO CAPS: 70.0000 mg | ORAL_CAPSULE | Freq: Every day | ORAL | 0 refills | Status: DC

## 2022-01-10 ENCOUNTER — Ambulatory Visit: Payer: No Typology Code available for payment source | Admitting: Nurse Practitioner

## 2022-01-11 ENCOUNTER — Other Ambulatory Visit: Payer: Self-pay | Admitting: Family Medicine

## 2022-02-01 ENCOUNTER — Other Ambulatory Visit: Payer: Self-pay

## 2022-02-01 DIAGNOSIS — F900 Attention-deficit hyperactivity disorder, predominantly inattentive type: Secondary | ICD-10-CM

## 2022-02-01 MED ORDER — LISDEXAMFETAMINE DIMESYLATE 70 MG PO CAPS: 70.0000 mg | ORAL_CAPSULE | Freq: Every day | ORAL | 0 refills | Status: DC

## 2022-03-03 ENCOUNTER — Other Ambulatory Visit: Payer: Self-pay

## 2022-03-03 DIAGNOSIS — F900 Attention-deficit hyperactivity disorder, predominantly inattentive type: Secondary | ICD-10-CM

## 2022-03-04 MED ORDER — LISDEXAMFETAMINE DIMESYLATE 70 MG PO CAPS: 70.0000 mg | ORAL_CAPSULE | Freq: Every day | ORAL | 0 refills | Status: DC

## 2022-03-17 ENCOUNTER — Other Ambulatory Visit: Payer: Self-pay | Admitting: Nurse Practitioner

## 2022-03-17 ENCOUNTER — Encounter: Payer: Self-pay | Admitting: Nurse Practitioner

## 2022-03-17 ENCOUNTER — Ambulatory Visit (INDEPENDENT_AMBULATORY_CARE_PROVIDER_SITE_OTHER): Payer: No Typology Code available for payment source | Admitting: Nurse Practitioner

## 2022-03-17 VITALS — BP 132/78 | HR 81 | Temp 97.5°F | Ht 66.0 in | Wt 161.0 lb

## 2022-03-17 DIAGNOSIS — F902 Attention-deficit hyperactivity disorder, combined type: Secondary | ICD-10-CM

## 2022-03-17 DIAGNOSIS — M25511 Pain in right shoulder: Secondary | ICD-10-CM

## 2022-03-17 DIAGNOSIS — F411 Generalized anxiety disorder: Secondary | ICD-10-CM

## 2022-03-17 DIAGNOSIS — E8940 Asymptomatic postprocedural ovarian failure: Secondary | ICD-10-CM

## 2022-03-17 DIAGNOSIS — Z1231 Encounter for screening mammogram for malignant neoplasm of breast: Secondary | ICD-10-CM

## 2022-03-17 MED ORDER — ESTROGENS CONJUGATED 1.25 MG PO TABS: 1.2500 mg | ORAL_TABLET | Freq: Every day | ORAL | 1 refills | Status: DC

## 2022-03-17 MED ORDER — LAMOTRIGINE ER 100 MG PO TB24: 100.0000 mg | ORAL_TABLET | Freq: Every day | ORAL | 1 refills | Status: DC

## 2022-03-17 MED ORDER — CYCLOBENZAPRINE HCL 10 MG PO TABS: 10.0000 mg | ORAL_TABLET | Freq: Three times a day (TID) | ORAL | 0 refills | Status: AC | PRN

## 2022-03-17 NOTE — Progress Notes (Signed)
? ?Subjective:  ?Patient ID: Lauren Contreras, female    DOB: August 16, 1976  Age: 46 y.o. MRN: [id number] ? ?Chief Complaint  ?Patient presents with  ? ADHD  ? Anxiety  ? ? ?HPI ? Lauren Contreras is a 46 year old Caucasian female that presents for follow-up of generalized anxiety disorder and ADHD. Reports muscle tightness to right trapezius area, neck, and shoulder for the past few weeks. States she has experienced increased work stress as Scientist, water quality at [employer]. She tells me she is turning repeatedly in a seated position during the workday as she completes tasks.Treatment has included massage and heat application with some relief.  ? ?Anxiety, Follow-up ? ?She was last seen for anxiety 3 months ago. ?Changes made at last visit include Lamictal. ?  ?She reports excellent compliance with treatment. ?She reports excellent tolerance of treatment. ?She is not having side effects.  ? ?Symptoms: ?No chest pain No difficulty concentrating  ?No dizziness No fatigue  ?No feelings of losing control No insomnia  ?No irritable No palpitations  ?No panic attacks No racing thoughts  ?No shortness of breath No sweating  ?No tremors/shakes   ? ?GAD-7 Results ? ?  03/17/2022  ?  2:56 PM  ?GAD-7 Generalized Anxiety Disorder Screening Tool  ?1. Feeling Nervous, Anxious, or on Edge 2  ?2. Not Being Able to Stop or Control Worrying 0  ?3. Worrying Too Much About Different Things 0  ?4. Trouble Relaxing 0  ?5. Being So Restless it's Hard To Sit Still 0  ?6. Becoming Easily Annoyed or Irritable 0  ?7. Feeling Afraid As If Something Awful Might Happen 0  ?Total GAD-7 Score 2  ?Difficulty At Work, Home, or Getting  Along With Others? Not difficult at all  ? ? ?PHQ-9 Scores ? ?  10/04/2021  ?  3:37 PM 08/26/2021  ?  2:23 PM 04/20/2021  ?  2:58 PM  ?PHQ9 SCORE ONLY  ?PHQ-9 Total Score 0 0 6  ? ?ADHD, inattentive type: ?Pt has history of adult ADHD, inattentive type for several years. Current treatment includes Vyvanse 70 mg daily. Pt  states symptoms are well-controlled. States medication wears off around 3 pm. Denies side effects.  ? ? ?Current Outpatient Medications on File Prior to Visit  ?Medication Sig Dispense Refill  ? estrogens, conjugated, (PREMARIN) 1.25 MG tablet Take 1 tablet (1.25 mg total) by mouth daily. 90 tablet 1  ? lamoTRIgine (LAMICTAL XR) 100 MG 24 hour tablet TAKE 1 TABLET BY MOUTH EVERY DAY 90 tablet 1  ? lisdexamfetamine (VYVANSE) 70 MG capsule Take 1 capsule (70 mg total) by mouth daily. 30 capsule 0  ? ondansetron (ZOFRAN ODT) 4 MG disintegrating tablet Take 1 tablet (4 mg total) by mouth every 8 (eight) hours as needed for nausea or vomiting. 30 tablet 1  ? furosemide (LASIX) 20 MG tablet Take 1 tablet (20 mg total) by mouth daily as needed. (Patient not taking: Reported on 03/17/2022) 90 tablet 3  ? valACYclovir (VALTREX) 500 MG tablet TAKE 1 TABLET BY MOUTH TWICE A DAY AS NEEDED (Patient not taking: Reported on 03/17/2022) 30 tablet 1  ? ?No current facility-administered medications on file prior to visit.  ? ?Past Medical History:  ?Diagnosis Date  ? ADHD (attention deficit hyperactivity disorder), inattentive type   ? Anxiety   ? Major depressive disorder, single episode with psychotic features (HCC) 01/13/2020  ? Migraine without aura, not intractable, without status migrainosus   ? Other specified menopausal and perimenopausal disorders   ? ?  Past Surgical History:  ?Procedure Laterality Date  ? ABDOMINAL HYSTERECTOMY  2014  ?  ?Family History  ?Problem Relation Age of Onset  ? Hypertension Mother   ? COPD Mother   ? CAD Father   ? Hypertension Father   ? Diabetes type II Father   ? Bipolar disorder Sister   ? Lung cancer Maternal Grandfather   ? Seizures Other   ? ?Social History  ? ?Socioeconomic History  ? Marital status: Married  ?  Spouse name: Not on file  ? Number of children: 3  ? Years of education: Not on file  ? Highest education level: Not on file  ?Occupational History  ? Occupation: Runner, broadcasting/film/video  ?Tobacco Use  ?  Smoking status: Former  ?  Types: Cigarettes  ?  Quit date: 2012  ?  Years since quitting: 11.2  ? Smokeless tobacco: Never  ?Substance and Sexual Activity  ? Alcohol use: Never  ? Drug use: Never  ? Sexual activity: Not on file  ?Other Topics Concern  ? Not on file  ?Social History Narrative  ? Not on file  ? ?Social Determinants of Health  ? ?Financial Resource Strain: Not on file  ?Food Insecurity: Not on file  ?Transportation Needs: Not on file  ?Physical Activity: Not on file  ?Stress: Not on file  ?Social Connections: Not on file  ? ? ?Review of Systems  ?Constitutional:  Negative for chills, fatigue and fever.  ?HENT:  Negative for congestion, ear pain, rhinorrhea and sore throat.   ?Respiratory:  Negative for cough and shortness of breath.   ?Cardiovascular:  Positive for leg swelling (intermittent). Negative for chest pain.  ?Gastrointestinal:  Negative for abdominal pain, constipation, diarrhea, nausea and vomiting.  ?Genitourinary:  Negative for dysuria and urgency.  ?Musculoskeletal:  Positive for arthralgias (Right shoulder) and myalgias (right trapezius area). Negative for back pain.  ?Neurological:  Negative for dizziness, weakness, light-headedness and headaches.  ?Psychiatric/Behavioral:  Negative for dysphoric mood. The patient is not nervous/anxious.   ? ? ?Objective:  ?BP 132/78   Pulse 81   Temp (!) 97.5 ?F (36.4 ?C)   Ht 5\' 6"  (1.676 m)   Wt 161 lb (73 kg)   SpO2 99%   BMI 25.99 kg/m?  ? ? ?  03/17/2022  ?  2:44 PM 10/04/2021  ?  3:32 PM 09/09/2021  ?  2:32 PM  ?BP/Weight  ?Systolic BP 132 120 118  ?Diastolic BP 78 76 82  ?Wt. (Lbs) 161 161 161  ?BMI 25.99 kg/m2 26.79 kg/m2 26.79 kg/m2  ? ? ?Physical Exam ?Vitals reviewed.  ?Constitutional:   ?   Appearance: Normal appearance.  ?HENT:  ?   Head: Normocephalic.  ?   Right Ear: Tympanic membrane normal.  ?   Left Ear: Tympanic membrane normal.  ?   Nose: Nose normal.  ?   Mouth/Throat:  ?   Mouth: Mucous membranes are moist.  ?Eyes:  ?    Pupils: Pupils are equal, round, and reactive to light.  ?Cardiovascular:  ?   Rate and Rhythm: Normal rate and regular rhythm.  ?   Pulses: Normal pulses.  ?   Heart sounds: Normal heart sounds.  ?Pulmonary:  ?   Effort: Pulmonary effort is normal.  ?   Breath sounds: Normal breath sounds.  ?Abdominal:  ?   General: Bowel sounds are normal.  ?   Palpations: Abdomen is soft.  ?Musculoskeletal:     ?   General: Normal range of  motion.  ?Skin: ?   General: Skin is warm and dry.  ?   Capillary Refill: Capillary refill takes less than 2 seconds.  ?Neurological:  ?   General: No focal deficit present.  ?   Mental Status: She is alert and oriented to person, place, and time.  ?Psychiatric:     ?   Mood and Affect: Mood normal.     ?   Behavior: Behavior normal.  ? ? ? ?  ? ?Lab Results  ?Component Value Date  ? WBC 8.1 08/26/2021  ? HGB 12.1 08/26/2021  ? HCT 34.6 08/26/2021  ? PLT 384 08/26/2021  ? GLUCOSE 98 08/26/2021  ? ALT 10 08/26/2021  ? AST 16 08/26/2021  ? NA 137 08/26/2021  ? K 4.3 08/26/2021  ? CL 99 08/26/2021  ? CREATININE 0.77 08/26/2021  ? BUN 11 08/26/2021  ? CO2 26 08/26/2021  ? TSH 3.230 08/26/2021  ? ? ? ? ?Assessment & Plan:  ? ?1. Attention deficit hyperactivity disorder (ADHD), combined type-well controlled ?-continue Vyvnase 70 mg as prescribed ? ?2. GAD (generalized anxiety disorder)-well controlled ?-continue Lamictal as prescribed ? ?3. Acute pain of right shoulder ?- cyclobenzaprine (FLEXERIL) 10 MG tablet; Take 1 tablet (10 mg total) by mouth 3 (three) times daily as needed for muscle spasms.  Dispense: 30 tablet; Refill: 0 ? ?4. Encounter for screening mammogram for malignant neoplasm of breast ?- MM Digital Screening; Future ?  ?Continue medications ?We will call you with mammogram for Wednesday, June 28th, 2023 ?Follow-up in 233-months ? ?  ? ?Follow-up: 813-months ? ?An After Visit Summary was printed and given to the patient. ? ?I, Janie MorningShannon J Dammon Makarewicz, NP, have reviewed all documentation for this  visit. The documentation on 03/18/22 for the exam, diagnosis, procedures, and orders are all accurate and complete.  ? ? ?Signed, ?Janie MorningShannon J Tykia Mellone, NP ?[facility] ?[phone]) [phone] ?

## 2022-03-17 NOTE — Patient Instructions (Signed)
Continue medications ?We will call you with mammogram for Wednesday, June 28th, 2023 ?Follow-up in 61-month ? ?Preventive Care [id number] Old, Female ?Preventive care refers to lifestyle choices and visits with your health care provider that can promote health and wellness. Preventive care visits are also called wellness exams. ?What can I expect for my preventive care visit? ?Counseling ?Your health care provider may ask you questions about your: ?Medical history, including: ?Past medical problems. ?Family medical history. ?Pregnancy history. ?Current health, including: ?Menstrual cycle. ?Method of birth control. ?Emotional well-being. ?Home life and relationship well-being. ?Sexual activity and sexual health. ?Lifestyle, including: ?Alcohol, nicotine or tobacco, and drug use. ?Access to firearms. ?Diet, exercise, and sleep habits. ?Work and work eStatistician ?Sunscreen use. ?Safety issues such as seatbelt and bike helmet use. ?Physical exam ?Your health care provider will check your: ?Height and weight. These may be used to calculate your BMI (body mass index). BMI is a measurement that tells if you are at a healthy weight. ?Waist circumference. This measures the distance around your waistline. This measurement also tells if you are at a healthy weight and may help predict your risk of certain diseases, such as type 2 diabetes and high blood pressure. ?Heart rate and blood pressure. ?Body temperature. ?Skin for abnormal spots. ?What immunizations do I need? ?Vaccines are usually given at various ages, according to a schedule. Your health care provider will recommend vaccines for you based on your age, medical history, and lifestyle or other factors, such as travel or where you work. ?What tests do I need? ?Screening ?Your health care provider may recommend screening tests for certain conditions. This may include: ?Lipid and cholesterol levels. ?Diabetes screening. This is done by checking your blood sugar  (glucose) after you have not eaten for a while (fasting). ?Pelvic exam and Pap test. ?Hepatitis B test. ?Hepatitis C test. ?HIV (human immunodeficiency virus) test. ?STI (sexually transmitted infection) testing, if you are at risk. ?Lung cancer screening. ?Colorectal cancer screening. ?Mammogram. Talk with your health care provider about when you should start having regular mammograms. This may depend on whether you have a family history of breast cancer. ?BRCA-related cancer screening. This may be done if you have a family history of breast, ovarian, tubal, or peritoneal cancers. ?Bone density scan. This is done to screen for osteoporosis. ?Talk with your health care provider about your test results, treatment options, and if necessary, the need for more tests. ?Follow these instructions at home: ?Eating and drinking ? ?Eat a diet that includes fresh fruits and vegetables, whole grains, lean protein, and low-fat dairy products. ?Take vitamin and mineral supplements as recommended by your health care provider. ?Do not drink alcohol if: ?Your health care provider tells you not to drink. ?You are pregnant, may be pregnant, or are planning to become pregnant. ?If you drink alcohol: ?Limit how much you have to 0-1 drink a day. ?Know how much alcohol is in your drink. In the [place] one drink equals one 12 oz bottle of beer (355 mL), one 5 oz glass of wine (148 mL), or one 1? oz glass of hard liquor (44 mL). ?Lifestyle ?Brush your teeth every morning and night with fluoride toothpaste. Floss one time each day. ?Exercise for at least 30 minutes 5 or more days each week. ?Do not use any products that contain nicotine or tobacco. These products include cigarettes, chewing tobacco, and vaping devices, such as e-cigarettes. If you need help quitting, ask your health care provider. ?Do not use drugs. ?If  you are sexually active, practice safe sex. Use a condom or other form of protection to prevent STIs. ?If you do not wish to  become pregnant, use a form of birth control. If you plan to become pregnant, see your health care provider for a prepregnancy visit. ?Take aspirin only as told by your health care provider. Make sure that you understand how much to take and what form to take. Work with your health care provider to find out whether it is safe and beneficial for you to take aspirin daily. ?Find healthy ways to manage stress, such as: ?Meditation, yoga, or listening to music. ?Journaling. ?Talking to a trusted person. ?Spending time with friends and family. ?Minimize exposure to UV radiation to reduce your risk of skin cancer. ?Safety ?Always wear your seat belt while driving or riding in a vehicle. ?Do not drive: ?If you have been drinking alcohol. Do not ride with someone who has been drinking. ?When you are tired or distracted. ?While texting. ?If you have been using any mind-altering substances or drugs. ?Wear a helmet and other protective equipment during sports activities. ?If you have firearms in your house, make sure you follow all gun safety procedures. ?Seek help if you have been physically or sexually abused. ?What's next? ?Visit your health care provider once a year for an annual wellness visit. ?Ask your health care provider how often you should have your eyes and teeth checked. ?Stay up to date on all vaccines. ?This information is not intended to replace advice given to you by your health care provider. Make sure you discuss any questions you have with your health care provider. ?Document Revised: 05/26/2021 Document Reviewed: 05/26/2021 ?Elsevier Patient Education ? [place]. ? ?

## 2022-04-05 ENCOUNTER — Other Ambulatory Visit: Payer: Self-pay

## 2022-04-05 DIAGNOSIS — F900 Attention-deficit hyperactivity disorder, predominantly inattentive type: Secondary | ICD-10-CM

## 2022-04-05 MED ORDER — LISDEXAMFETAMINE DIMESYLATE 70 MG PO CAPS: 70.0000 mg | ORAL_CAPSULE | Freq: Every day | ORAL | 0 refills | Status: DC

## 2022-04-20 ENCOUNTER — Ambulatory Visit: Payer: No Typology Code available for payment source | Admitting: Nurse Practitioner

## 2022-04-25 ENCOUNTER — Other Ambulatory Visit: Payer: Self-pay | Admitting: Nurse Practitioner

## 2022-04-25 ENCOUNTER — Other Ambulatory Visit: Payer: Self-pay

## 2022-04-25 DIAGNOSIS — N958 Other specified menopausal and perimenopausal disorders: Secondary | ICD-10-CM

## 2022-04-25 DIAGNOSIS — E8940 Asymptomatic postprocedural ovarian failure: Secondary | ICD-10-CM

## 2022-04-25 MED ORDER — ESTROGENS CONJUGATED 1.25 MG PO TABS: 2.5000 mg | ORAL_TABLET | Freq: Every day | ORAL | 0 refills | Status: DC

## 2022-05-06 ENCOUNTER — Other Ambulatory Visit: Payer: Self-pay

## 2022-05-06 DIAGNOSIS — F900 Attention-deficit hyperactivity disorder, predominantly inattentive type: Secondary | ICD-10-CM

## 2022-05-06 MED ORDER — LISDEXAMFETAMINE DIMESYLATE 70 MG PO CAPS: 70.0000 mg | ORAL_CAPSULE | Freq: Every day | ORAL | 0 refills | Status: DC

## 2022-05-23 ENCOUNTER — Other Ambulatory Visit: Payer: Self-pay

## 2022-05-23 DIAGNOSIS — N958 Other specified menopausal and perimenopausal disorders: Secondary | ICD-10-CM

## 2022-05-23 MED ORDER — ESTROGENS CONJUGATED 1.25 MG PO TABS: 2.5000 mg | ORAL_TABLET | Freq: Every day | ORAL | 0 refills | Status: DC

## 2022-05-23 NOTE — Telephone Encounter (Signed)
I left detailed message on voicemail. ?

## 2022-05-31 ENCOUNTER — Encounter: Payer: Self-pay | Admitting: Nurse Practitioner

## 2022-05-31 ENCOUNTER — Ambulatory Visit (INDEPENDENT_AMBULATORY_CARE_PROVIDER_SITE_OTHER): Payer: No Typology Code available for payment source | Admitting: Nurse Practitioner

## 2022-05-31 VITALS — BP 130/70 | HR 74 | Temp 97.0°F | Ht 66.0 in | Wt 160.6 lb

## 2022-05-31 DIAGNOSIS — Z79890 Hormone replacement therapy (postmenopausal): Secondary | ICD-10-CM | POA: Diagnosis not present

## 2022-05-31 DIAGNOSIS — F411 Generalized anxiety disorder: Secondary | ICD-10-CM | POA: Diagnosis not present

## 2022-05-31 DIAGNOSIS — F900 Attention-deficit hyperactivity disorder, predominantly inattentive type: Secondary | ICD-10-CM | POA: Diagnosis not present

## 2022-05-31 DIAGNOSIS — N951 Menopausal and female climacteric states: Secondary | ICD-10-CM

## 2022-05-31 DIAGNOSIS — Z6825 Body mass index (BMI) 25.0-25.9, adult: Secondary | ICD-10-CM

## 2022-05-31 MED ORDER — ESTROGENS CONJUGATED 1.25 MG PO TABS: 1.2500 mg | ORAL_TABLET | Freq: Every day | ORAL | 1 refills | Status: DC

## 2022-05-31 NOTE — Patient Instructions (Signed)
Decrease Premarin to 1.25 mg tablets daily We will call you with lab results Keep appt for mammogram as scheduled Follow-up in 49-months  Menopause and Hormone Replacement Therapy Menopause is a normal time of life when menstrual periods stop completely and the ovaries stop producing the female hormones estrogen and progesterone. Low levels of these hormones can affect your health and cause symptoms. Hormone replacement therapy (HRT) can relieve some of those symptoms. HRT is the use of artificial (synthetic) hormones to replace hormones that your body has stopped producing because you have reached menopause. Types of HRT  HRT may consist of the synthetic hormones estrogen and progestin, or it may consist of estrogen-only therapy. You and your health care provider will decide which form of HRT is best for you. If you choose to be on HRT and you have a uterus, estrogen and progestin are usually prescribed. Estrogen-only therapy is used for women who do not have a uterus. Possible options for taking HRT include: Pills. Patches. Gels. Sprays. Vaginal cream. Vaginal rings. Vaginal inserts. The amount of hormones that you take and how long you take them varies according to your health. It is important to: Begin HRT with the lowest possible dosage. Stop HRT as soon as your health care provider tells you to stop. Work with your health care provider so that you feel informed and comfortable with your decisions. Tell a health care provider about: Any allergies you have. Whether you have had blood clots or know of any risk factors you may have for blood clots. Whether you or family members have had cancer, especially cancer of the breasts, ovaries, or uterus. Any surgeries you have had. All medicines you are taking, including vitamins, herbs, eye drops, creams, and over-the-counter medicines. Whether you are pregnant or may be pregnant. Any medical conditions you have. What are the  benefits? HRT can reduce the frequency and severity of menopausal symptoms. Benefits of HRT vary according to the kind of symptoms that you have, how severe they are, and your overall health. HRT may help to improve the following symptoms of menopause: Hot flashes and night sweats. These are sudden feelings of heat that spread over the face and body. The skin may turn red, like a blush. Night sweats are hot flashes that happen while you are sleeping or trying to sleep. Bone loss (osteoporosis). The body loses calcium more quickly after menopause, causing the bones to become weaker. This can increase the risk for bone breaks (fractures). Vaginal dryness. The lining of the vagina can become thin and dry, which can cause pain during sex or cause infection, burning, or itching. Urinary tract infections. Urinary incontinence. This is the inability to control when you urinate. Irritability. Short-term memory problems. What are the risks? Risks of HRT vary depending on your individual health and medical history. Risks of HRT also depend on whether you receive both estrogen and progestin or you receive estrogen only. HRT may increase the risk of: Spotting. This is when a small amount of blood leaks from the vagina unexpectedly. Endometrial cancer. This cancer is in the lining of the uterus (endometrium). Breast cancer. Increased density of breast tissue. This can make it harder to find breast cancer on a breast X-ray (mammogram). Stroke. Heart disease. Blood clots. Gallbladder disease or liver disease. Risks of HRT can increase if you have any of the following conditions: Endometrial cancer. Liver disease. Heart disease. Breast cancer. History of blood clots. History of stroke. Follow these instructions at home: Pap tests  Have Pap tests done as often as told by your health care provider. A Pap test is sometimes called a Pap smear. It is a screening test that is used to check for signs of cancer  of the cervix and vagina. A Pap test can also identify the presence of infection or precancerous changes. Pap tests may be done: Every 3 years, starting at age 25. Every 5 years, starting after age 64, in combination with testing for human papillomavirus (HPV). More often or less often depending on other medical conditions you have, your age, and other risk factors. It is up to you to get the results of your Pap test. Ask your health care provider, or the department that is doing the test, when your results will be ready. General instructions Take over-the-counter and prescription medicines only as told by your health care provider. Do not use any products that contain nicotine or tobacco. These products include cigarettes, chewing tobacco, and vaping devices, such as e-cigarettes. If you need help quitting, ask your health care provider. Get mammograms, pelvic exams, and medical checkups as often as told by your health care provider. Keep all follow-up visits. This is important. Contact a health care provider if you have: Pain or swelling in your legs. Lumps or changes in your breasts or armpits. Pain, burning, or bleeding when you urinate. Unusual vaginal bleeding. Dizziness or headaches. Pain in your abdomen. Get help right away if you have: Shortness of breath. Chest pain. Slurred speech. Weakness or numbness in any part of your arms or legs. These symptoms may represent a serious problem that is an emergency. Do not wait to see if the symptoms will go away. Get medical help right away. Call your local emergency services (911 in the [place] Do not drive yourself to the hospital. Summary Menopause is a normal time of life when menstrual periods stop completely and the ovaries stop producing the female hormones estrogen and progesterone. HRT can reduce the frequency and severity of menopausal symptoms. Risks of HRT vary depending on your individual health and medical history. This  information is not intended to replace advice given to you by your health care provider. Make sure you discuss any questions you have with your health care provider. Document Revised: 06/01/2020 Document Reviewed: 06/01/2020 Elsevier Patient Education  2023 [employer].

## 2022-05-31 NOTE — Progress Notes (Signed)
Established Patient Office Visit  Subjective   Patient ID: Lauren Contreras, female    DOB: [birth year]  Age: 46 y.o. MRN: [id number]  CC HRT Depression with anxiety ADHD  HPI Lauren Contreras is a 46 year old female that presents for follow-up of hormone replacement therapy, depression with anxiety, and ADHD. 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 She has been prescribed Premarin 2.5 mg QD s/p hysterectomy 2014. She is aware of increased risk of breast cancer and cardiac event. States she would like to taper down dose during the summer prior to beginning new job. She is scheduled for screening mammogram 06/08/22.   ADHD, follow-up: Lauren Contreras was diagnosed with ADHD-inattentive type several years ago. Current treatment is Vyvanse 70 mg QD. States symptoms are well controlled. Denies side effects of treatment.   Depression, Follow-up  She  was last seen for this 3 months ago. Current meds includes Lamictal 100 mg QD  She reports excellent compliance with treatment. She is not having side effects.      She reports excellent tolerance of treatment. Current symptoms include: insomnia She feels she is Unchanged since last visit.     10/04/2021    3:37 PM 08/26/2021    2:23 PM 04/20/2021    2:58 PM  Depression screen PHQ 2/9  Decreased Interest 0 0 0  Down, Depressed, Hopeless 0 0 0  PHQ - 2 Score 0 0 0  Altered sleeping 0  1  Tired, decreased energy 0  3  Change in appetite 0  1  Feeling bad or failure about yourself  0  0  Trouble concentrating 0  1  Moving slowly or fidgety/restless 0  0  Suicidal thoughts 0  0  PHQ-9 Score 0  6  Difficult doing work/chores Not difficult at all  Somewhat difficult     Anxiety, Follow-up  She was last seen for anxiety 3 months ago. Current meds include Lamictal 100 mg QD  She reports excellent compliance with treatment. She reports excellent tolerance of treatment. She is not having side effects.       She  feels her anxiety is moderate and Unchanged since last visit.   GAD-7 Results    03/17/2022    2:56 PM  GAD-7 Generalized Anxiety Disorder Screening Tool  1. Feeling Nervous, Anxious, or on Edge 2  2. Not Being Able to Stop or Control Worrying 0  3. Worrying Too Much About Different Things 0  4. Trouble Relaxing 0  5. Being So Restless it's Hard To Sit Still 0  6. Becoming Easily Annoyed or Irritable 0  7. Feeling Afraid As If Something Awful Might Happen 0  Total GAD-7 Score 2  Difficulty At Work, Home, or Getting  Along With Others? Not difficult at all      Review of Systems  Constitutional:  Negative for chills, fever and malaise/fatigue.  HENT:  Negative for congestion, ear pain, sinus pain and sore throat.   Eyes:  Negative for pain.  Respiratory:  Negative for cough.   Cardiovascular:  Negative for chest pain and orthopnea.  Gastrointestinal:  Negative for abdominal pain, constipation, diarrhea, heartburn, nausea and vomiting.  Genitourinary:  Negative for dysuria, frequency and urgency.  Musculoskeletal:  Negative for back pain, joint pain and myalgias.  Skin:  Negative for rash.  Neurological:  Negative for dizziness and headaches.  Endo/Heme/Allergies:  Negative for environmental allergies.  Psychiatric/Behavioral:  Negative for depression. The patient is not nervous/anxious.  Objective:     BP 130/70   Pulse 74   Temp (!) 97 F (36.1 C)   Ht 5\' 6"  (1.676 m)   Wt 160 lb 9.6 oz (72.8 kg)   SpO2 99%   BMI 25.92 kg/m    Physical Exam Vitals reviewed.  Constitutional:      Appearance: Normal appearance.  Pulmonary:     Effort: Pulmonary effort is normal.  Skin:    General: Skin is warm and dry.     Capillary Refill: Capillary refill takes less than 2 seconds.  Neurological:     General: No focal deficit present.     Mental Status: She is alert and oriented to person, place, and time.  Psychiatric:        Mood and Affect: Mood normal.         Behavior: Behavior normal.     Assessment & Plan:   1. Vasomotor symptoms due to menopause - estrogens, conjugated, (PREMARIN) 1.25 MG tablet; Take 1 tablet (1.25 mg total) by mouth daily.  Dispense: 30 tablet; Refill: 1  2. Generalized anxiety disorder - CBC with Differential/Platelet - Comprehensive metabolic panel - Lipid panel - TSH -continue Lamictal as prescribed  3. Attention deficit hyperactivity disorder (ADHD), inattentive type, mild - CBC with Differential/Platelet - Comprehensive metabolic panel -Continue Vyvanse 70 mg QD  4. Hormone replacement therapy - CBC with Differential/Platelet - Comprehensive metabolic panel -Decrease Premarin to 1.25 mg QD  5. BMI 25.0-25.9,adult - CBC with Differential/Platelet - Comprehensive metabolic panel - Lipid panel - TSH    Decrease Premarin to 1.25 mg tablets daily We will call you with lab results Keep appt for mammogram as scheduled Follow-up in 52-months   Follow-up: 54-months  I, 2-month, NP, have reviewed all documentation for this visit. The documentation on 05/31/22 for the exam, diagnosis, procedures, and orders are all accurate and complete.   Signed, 06/02/22, NP

## 2022-06-01 ENCOUNTER — Other Ambulatory Visit: Payer: Self-pay

## 2022-06-01 LAB — CBC WITH DIFFERENTIAL/PLATELET
Basophils Absolute: 0 10*3/uL (ref 0.0–0.2)
Basos: 0 %
EOS (ABSOLUTE): 0.1 10*3/uL (ref 0.0–0.4)
Eos: 2 %
Hematocrit: 34.4 % (ref 34.0–46.6)
Hemoglobin: 11.9 g/dL (ref 11.1–15.9)
Immature Grans (Abs): 0 10*3/uL (ref 0.0–0.1)
Immature Granulocytes: 0 %
Lymphocytes Absolute: 3 10*3/uL (ref 0.7–3.1)
Lymphs: 45 %
MCH: 32 pg (ref 26.6–33.0)
MCHC: 34.6 g/dL (ref 31.5–35.7)
MCV: 93 fL (ref 79–97)
Monocytes Absolute: 0.5 10*3/uL (ref 0.1–0.9)
Monocytes: 8 %
Neutrophils Absolute: 3 10*3/uL (ref 1.4–7.0)
Neutrophils: 45 %
Platelets: 357 10*3/uL (ref 150–450)
RBC: 3.72 x10E6/uL — ABNORMAL LOW (ref 3.77–5.28)
RDW: 12.3 % (ref 11.7–15.4)
WBC: 6.7 10*3/uL (ref 3.4–10.8)

## 2022-06-01 LAB — LIPID PANEL
Chol/HDL Ratio: 4.4 ratio (ref 0.0–4.4)
Cholesterol, Total: 216 mg/dL — ABNORMAL HIGH (ref 100–199)
HDL: 49 mg/dL (ref >39)
LDL Chol Calc (NIH): 114 mg/dL — ABNORMAL HIGH (ref 0–99)
Triglycerides: 307 mg/dL — ABNORMAL HIGH (ref 0–149)
VLDL Cholesterol Cal: 53 mg/dL — ABNORMAL HIGH (ref 5–40)

## 2022-06-01 LAB — COMPREHENSIVE METABOLIC PANEL
ALT: 13 IU/L (ref 0–32)
AST: 15 IU/L (ref 0–40)
Albumin/Globulin Ratio: 1.5 (ref 1.2–2.2)
Albumin: 3.9 g/dL (ref 3.8–4.8)
Alkaline Phosphatase: 53 IU/L (ref 44–121)
BUN/Creatinine Ratio: 14 (ref 9–23)
BUN: 11 mg/dL (ref 6–24)
Bilirubin Total: <0.2 mg/dL (ref 0.0–1.2)
CO2: 22 mmol/L (ref 20–29)
Calcium: 9 mg/dL (ref 8.7–10.2)
Chloride: 103 mmol/L (ref 96–106)
Creatinine, Ser: 0.8 mg/dL (ref 0.57–1.00)
Globulin, Total: 2.6 g/dL (ref 1.5–4.5)
Glucose: 92 mg/dL (ref 70–99)
Potassium: 4.3 mmol/L (ref 3.5–5.2)
Sodium: 139 mmol/L (ref 134–144)
Total Protein: 6.5 g/dL (ref 6.0–8.5)
eGFR: 93 mL/min/{1.73_m2} (ref >59)

## 2022-06-01 LAB — CARDIOVASCULAR RISK ASSESSMENT

## 2022-06-01 LAB — TSH: TSH: 5.99 u[IU]/mL — ABNORMAL HIGH (ref 0.450–4.500)

## 2022-06-01 MED ORDER — ROSUVASTATIN CALCIUM 5 MG PO TABS: 5.0000 mg | ORAL_TABLET | Freq: Every day | ORAL | 0 refills | Status: DC

## 2022-06-06 ENCOUNTER — Other Ambulatory Visit: Payer: Self-pay | Admitting: Nurse Practitioner

## 2022-06-06 DIAGNOSIS — R110 Nausea: Secondary | ICD-10-CM

## 2022-06-06 LAB — T4, FREE: Free T4: 1.13 ng/dL (ref 0.82–1.77)

## 2022-06-06 LAB — SPECIMEN STATUS REPORT

## 2022-06-08 ENCOUNTER — Other Ambulatory Visit: Payer: Self-pay

## 2022-06-08 ENCOUNTER — Ambulatory Visit
Admission: RE | Admit: 2022-06-08 | Discharge: 2022-06-08 | Disposition: A | Discharge status: Home / Self Care | Payer: No Typology Code available for payment source | Source: Ambulatory Visit | Attending: Nurse Practitioner | Admitting: Nurse Practitioner

## 2022-06-08 DIAGNOSIS — F900 Attention-deficit hyperactivity disorder, predominantly inattentive type: Secondary | ICD-10-CM

## 2022-06-08 DIAGNOSIS — E038 Other specified hypothyroidism: Secondary | ICD-10-CM

## 2022-06-08 DIAGNOSIS — Z1231 Encounter for screening mammogram for malignant neoplasm of breast: Secondary | ICD-10-CM

## 2022-06-08 MED ORDER — LISDEXAMFETAMINE DIMESYLATE 70 MG PO CAPS: 70.0000 mg | ORAL_CAPSULE | Freq: Every day | ORAL | 0 refills | Status: DC

## 2022-06-08 MED ORDER — LEVOTHYROXINE SODIUM 25 MCG PO TABS: 25.0000 ug | ORAL_TABLET | Freq: Every day | ORAL | 0 refills | Status: DC

## 2022-06-20 ENCOUNTER — Ambulatory Visit: Payer: No Typology Code available for payment source | Admitting: Nurse Practitioner

## 2022-07-12 ENCOUNTER — Encounter: Payer: Self-pay | Admitting: Nurse Practitioner

## 2022-07-13 ENCOUNTER — Other Ambulatory Visit: Payer: Self-pay

## 2022-07-13 DIAGNOSIS — F411 Generalized anxiety disorder: Secondary | ICD-10-CM

## 2022-07-13 DIAGNOSIS — N951 Menopausal and female climacteric states: Secondary | ICD-10-CM

## 2022-07-13 DIAGNOSIS — F900 Attention-deficit hyperactivity disorder, predominantly inattentive type: Secondary | ICD-10-CM

## 2022-07-13 MED ORDER — ESTROGENS CONJUGATED 1.25 MG PO TABS: 1.2500 mg | ORAL_TABLET | Freq: Every day | ORAL | 1 refills | Status: DC

## 2022-07-13 MED ORDER — LAMOTRIGINE ER 100 MG PO TB24: 100.0000 mg | ORAL_TABLET | Freq: Every day | ORAL | 1 refills | Status: DC

## 2022-07-13 MED ORDER — LISDEXAMFETAMINE DIMESYLATE 70 MG PO CAPS: 70.0000 mg | ORAL_CAPSULE | Freq: Every day | ORAL | 0 refills | Status: DC

## 2022-07-14 ENCOUNTER — Other Ambulatory Visit: Payer: Self-pay | Admitting: Nurse Practitioner

## 2022-07-14 ENCOUNTER — Telehealth: Payer: Self-pay

## 2022-07-14 DIAGNOSIS — F900 Attention-deficit hyperactivity disorder, predominantly inattentive type: Secondary | ICD-10-CM

## 2022-07-14 DIAGNOSIS — Z79890 Hormone replacement therapy (postmenopausal): Secondary | ICD-10-CM

## 2022-07-14 MED ORDER — ESTRADIOL 2 MG PO TABS: 2.0000 mg | ORAL_TABLET | Freq: Every day | ORAL | 0 refills | Status: DC

## 2022-07-14 MED ORDER — LISDEXAMFETAMINE DIMESYLATE 60 MG PO CAPS: 60.0000 mg | ORAL_CAPSULE | ORAL | 0 refills | Status: DC

## 2022-07-14 NOTE — Telephone Encounter (Addendum)
Patient called and stated she will not have insurance for 1 month being that her insurance is changing. She would like for you to send her in estradiol instead of Premarin until her insurance kick in. Also stated that nobody has the Vyvanse 70 mg and wanted to know if provider could call her in the 60 mg they do have. Also wanted to know if there is another cheaper medication like Vyvanse that she can take until her insurance kick in next month because it's 700 dollars, or if there is a coupon she can get for the Vyvanse. She can not take Adderall. Please advise.  4;16 PM: Patient called back, she spoke with pharmacy and the 60 mg Vyvanse would be cheaper than the 70 mg if you could just call that one in.  Pharmacy: [facility]

## 2022-07-15 NOTE — Telephone Encounter (Signed)
Called left detailed message for patient to call back with question

## 2022-08-16 ENCOUNTER — Other Ambulatory Visit: Payer: Self-pay

## 2022-08-16 DIAGNOSIS — E038 Other specified hypothyroidism: Secondary | ICD-10-CM

## 2022-08-17 LAB — TSH: TSH: 1.84 u[IU]/mL (ref 0.450–4.500)

## 2022-08-18 ENCOUNTER — Other Ambulatory Visit: Payer: Self-pay | Admitting: Nurse Practitioner

## 2022-08-18 DIAGNOSIS — F900 Attention-deficit hyperactivity disorder, predominantly inattentive type: Secondary | ICD-10-CM

## 2022-08-18 MED ORDER — LISDEXAMFETAMINE DIMESYLATE 50 MG PO CAPS: 50.0000 mg | ORAL_CAPSULE | Freq: Every day | ORAL | 0 refills | Status: DC

## 2022-08-22 ENCOUNTER — Telehealth: Payer: Self-pay

## 2022-08-22 NOTE — Telephone Encounter (Signed)
PA submitted and waiting for approval or denial for Vyvanse. Patient states she has tried and failed adderall, ritalin, focalin, concerta, dexedrine and quillivant.

## 2022-08-22 NOTE — Telephone Encounter (Signed)
PA submitted and approved for Vyvanse via covermymeds. Patient aware.

## 2022-09-19 ENCOUNTER — Other Ambulatory Visit: Payer: Self-pay

## 2022-09-19 DIAGNOSIS — F411 Generalized anxiety disorder: Secondary | ICD-10-CM

## 2022-09-19 DIAGNOSIS — Z79890 Hormone replacement therapy (postmenopausal): Secondary | ICD-10-CM

## 2022-09-19 MED ORDER — LAMOTRIGINE ER 100 MG PO TB24: 100.0000 mg | ORAL_TABLET | Freq: Every day | ORAL | 1 refills | Status: DC

## 2022-09-19 MED ORDER — ESTRADIOL 2 MG PO TABS: 2.0000 mg | ORAL_TABLET | Freq: Every day | ORAL | 0 refills | Status: DC

## 2022-09-20 ENCOUNTER — Other Ambulatory Visit: Payer: Self-pay

## 2022-09-20 DIAGNOSIS — F900 Attention-deficit hyperactivity disorder, predominantly inattentive type: Secondary | ICD-10-CM

## 2022-09-20 MED ORDER — LISDEXAMFETAMINE DIMESYLATE 50 MG PO CAPS: 50.0000 mg | ORAL_CAPSULE | Freq: Every day | ORAL | 0 refills | Status: DC

## 2022-10-13 ENCOUNTER — Other Ambulatory Visit: Payer: Self-pay

## 2022-10-13 MED ORDER — LEVOTHYROXINE SODIUM 25 MCG PO TABS: 25.0000 ug | ORAL_TABLET | Freq: Every day | ORAL | 0 refills | Status: AC

## 2022-10-13 MED ORDER — ROSUVASTATIN CALCIUM 5 MG PO TABS: 5.0000 mg | ORAL_TABLET | Freq: Every day | ORAL | 0 refills | Status: AC

## 2022-10-19 ENCOUNTER — Other Ambulatory Visit: Payer: Self-pay

## 2022-10-19 ENCOUNTER — Other Ambulatory Visit: Payer: Self-pay | Admitting: Nurse Practitioner

## 2022-10-19 DIAGNOSIS — F900 Attention-deficit hyperactivity disorder, predominantly inattentive type: Secondary | ICD-10-CM

## 2022-10-19 MED ORDER — LISDEXAMFETAMINE DIMESYLATE 50 MG PO CAPS: 50.0000 mg | ORAL_CAPSULE | Freq: Every day | ORAL | 0 refills | Status: DC

## 2022-10-20 NOTE — Telephone Encounter (Signed)
The pharmacy at [facility] called and stated that [place] .  There is a no generic for this medication but the pharmancy stated that if you would put in a special code [id number] that the patient only has to pay $50.  The patient agreed to that price.  Please advise

## 2022-10-21 ENCOUNTER — Ambulatory Visit: Payer: BC Managed Care – PPO | Admitting: Nurse Practitioner

## 2022-10-21 ENCOUNTER — Encounter: Payer: Self-pay | Admitting: Nurse Practitioner

## 2022-10-21 VITALS — BP 118/72 | HR 74 | Temp 96.7°F | Ht 65.0 in | Wt 165.0 lb

## 2022-10-21 DIAGNOSIS — F411 Generalized anxiety disorder: Secondary | ICD-10-CM

## 2022-10-21 DIAGNOSIS — R110 Nausea: Secondary | ICD-10-CM | POA: Diagnosis not present

## 2022-10-21 DIAGNOSIS — Z6827 Body mass index (BMI) 27.0-27.9, adult: Secondary | ICD-10-CM

## 2022-10-21 DIAGNOSIS — F900 Attention-deficit hyperactivity disorder, predominantly inattentive type: Secondary | ICD-10-CM

## 2022-10-21 DIAGNOSIS — E038 Other specified hypothyroidism: Secondary | ICD-10-CM

## 2022-10-21 MED ORDER — ONDANSETRON HCL 4 MG PO TABS: 4.0000 mg | ORAL_TABLET | Freq: Three times a day (TID) | ORAL | 0 refills | Status: AC | PRN

## 2022-10-21 MED ORDER — SEMAGLUTIDE-WEIGHT MANAGEMENT 2.4 MG/0.75ML ~~LOC~~ SOAJ: 2.4000 mg | SUBCUTANEOUS | 0 refills | Status: AC

## 2022-10-21 MED ORDER — SEMAGLUTIDE-WEIGHT MANAGEMENT 1.7 MG/0.75ML ~~LOC~~ SOAJ: 1.7000 mg | SUBCUTANEOUS | 0 refills | Status: AC

## 2022-10-21 MED ORDER — SEMAGLUTIDE-WEIGHT MANAGEMENT 1 MG/0.5ML ~~LOC~~ SOAJ: 1.0000 mg | SUBCUTANEOUS | 0 refills | Status: AC

## 2022-10-21 MED ORDER — SEMAGLUTIDE-WEIGHT MANAGEMENT 0.25 MG/0.5ML ~~LOC~~ SOAJ: 0.2500 mg | SUBCUTANEOUS | 0 refills | Status: AC

## 2022-10-21 MED ORDER — VYVANSE 60 MG PO CAPS: 60.0000 mg | ORAL_CAPSULE | ORAL | 0 refills | Status: DC

## 2022-10-21 MED ORDER — SEMAGLUTIDE-WEIGHT MANAGEMENT 0.5 MG/0.5ML ~~LOC~~ SOAJ: 0.5000 mg | SUBCUTANEOUS | 0 refills | Status: AC

## 2022-10-21 NOTE — Progress Notes (Unsigned)
Subjective:  Patient ID: Lauren Contreras, female    DOB: [birth year]  Age: 46 y.o. MRN: [id number]  Chief Complaint  Patient presents with   Anxiety   Hypothyroidism   Hyperlipidemia   HPI: Pt presents for follow-up of hypothyroidism, GAD, and ADHD. She reports hypothyroidism of inability to lose weight. Current weight 165 lbs, BMI 27.46. States being overweight has bothered her. She has modified her diet, increased physical activity, and joined Weight Watchers without success. She underwent hysterectomy 10 years ago. States surgical menopause has contributed to weight gain and difficulty to lose. She is on hormone replacement.    Lauren Contreras is a 46 year old female that presents for follow-up of hormone replacement therapy, depression with anxiety, and ADHD.   Lipid/Cholesterol, Follow-up  Last lipid panel Other pertinent labs  Lab Results  Component Value Date   CHOL 216 (H) 05/31/2022   HDL 49 05/31/2022   LDLCALC 114 (H) 05/31/2022   TRIG 307 (H) 05/31/2022   CHOLHDL 4.4 05/31/2022   Lab Results  Component Value Date   ALT 13 05/31/2022   AST 15 05/31/2022   PLT 357 05/31/2022   TSH 1.840 08/16/2022     Management includes crestor 5 mg .  She reports excellent compliance with treatment. She is not having side effects.   Current diet: in general, a "healthy" diet   Current exercise: walking   Depression and Anxiety, Follow-up  Current treatment includes lamictal 100 mg qd.   She reports excellent compliance with treatment. She is not having side effects.   She reports excellent tolerance of treatment. Current symptoms include: {Symptoms; depression:1002} She feels she is {improved/worse/unchanged:3041574} since last visit.     10/21/2022   10:23 AM 10/04/2021    3:37 PM 08/26/2021    2:23 PM  Depression screen PHQ 2/9  Decreased Interest 0 0 0  Down, Depressed, Hopeless 0 0 0  PHQ - 2 Score 0 0 0  Altered sleeping 0 0   Tired, decreased energy 0 0    Change in appetite 0 0   Feeling bad or failure about yourself  0 0   Trouble concentrating 0 0   Moving slowly or fidgety/restless 0 0   Suicidal thoughts 0 0   PHQ-9 Score 0 0   Difficult doing work/chores Not difficult at all Not difficult at all       Current Outpatient Medications on File Prior to Visit  Medication Sig Dispense Refill   cyclobenzaprine (FLEXERIL) 10 MG tablet Take 1 tablet (10 mg total) by mouth 3 (three) times daily as needed for muscle spasms. 30 tablet 0   estradiol (ESTRACE) 2 MG tablet Take 1 tablet (2 mg total) by mouth daily. 90 tablet 0   furosemide (LASIX) 20 MG tablet Take 1 tablet (20 mg total) by mouth daily as needed. (Patient not taking: Reported on 03/17/2022) 90 tablet 3   lamoTRIgine (LAMICTAL XR) 100 MG 24 hour tablet Take 1 tablet (100 mg total) by mouth daily. 90 tablet 1   levothyroxine (SYNTHROID) 25 MCG tablet Take 1 tablet (25 mcg total) by mouth daily. 90 tablet 0   lisdexamfetamine (VYVANSE) 50 MG capsule Take 1 capsule (50 mg total) by mouth daily. 30 capsule 0   ondansetron (ZOFRAN-ODT) 4 MG disintegrating tablet TAKE 1 TABLET BY MOUTH EVERY 8 HOURS AS NEEDED FOR NAUSEA AND VOMITING 30 tablet 1   rosuvastatin (CRESTOR) 5 MG tablet Take 1 tablet (5 mg total) by mouth daily. 90 tablet 0  valACYclovir (VALTREX) 500 MG tablet TAKE 1 TABLET BY MOUTH TWICE A DAY AS NEEDED (Patient not taking: Reported on 03/17/2022) 30 tablet 1   No current facility-administered medications on file prior to visit.   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   Migraine without aura, not intractable, without status migrainosus    Other specified menopausal and perimenopausal disorders    Past Surgical History:  Procedure Laterality Date   ABDOMINAL HYSTERECTOMY  2014    Family History  Problem  Relation Age of Onset   Hypertension Mother    COPD Mother    CAD Father    Hypertension Father    Diabetes type II Father    Bipolar disorder Sister    Lung cancer Maternal Grandfather    Seizures Other    Social History   Socioeconomic History   Marital status: Married    Spouse name: Not on file   Number of children: 3   Years of education: Not on file   Highest education level: Not on file  Occupational History   Occupation: Teacher  Tobacco Use   Smoking status: Former    Types: Cigarettes    Quit date: 2012    Years since quitting: 11.8   Smokeless tobacco: Never  Substance and Sexual Activity   Alcohol use: Never   Drug use: Never   Sexual activity: Not on file  Other Topics Concern   Not on file  Social History Narrative   Not on file   Social Determinants of Health   Financial Resource Strain: Low Risk  (05/31/2022)   Overall Financial Resource Strain (CARDIA)    Difficulty of Paying Living Expenses: Not hard at all  Food Insecurity: No Food Insecurity (05/31/2022)   Hunger Vital Sign    Worried About Running Out of Food in the Last Year: Never true    Ran Out of Food in the Last Year: Never true  Transportation Needs: No Transportation Needs (05/31/2022)   PRAPARE - Administrator, Civil Service (Medical): No    Lack of Transportation (Non-Medical): No  Physical Activity: Inactive (05/31/2022)   Exercise Vital Sign    Days of Exercise per Week: 0 days    Minutes of Exercise per Session: 0 min  Stress: No Stress Concern Present (05/31/2022)   [employer] of Occupational Health - Occupational Stress Questionnaire    Feeling of Stress : Not at all  Social Connections: Moderately Integrated (05/31/2022)   Social Connection and Isolation Panel [NHANES]    Frequency of Communication with Friends and Family: More than three times a week    Frequency of Social Gatherings with Friends and Family: More than three times a week    Attends Religious  Services: More than 4 times per year    Active Member of [employer] or Organizations: No    Attends Banker Meetings: Never    Marital Status: Married    Review of Systems  Constitutional:  Negative for chills, fatigue and fever.  HENT:  Negative for congestion, ear pain, rhinorrhea and sore throat.   Respiratory:  Negative for cough and shortness of breath.   Cardiovascular:  Negative for chest pain.  Gastrointestinal:  Negative for abdominal pain, constipation, diarrhea, nausea and vomiting.  Genitourinary:  Negative for dysuria and urgency.  Musculoskeletal:  Negative for back pain  and myalgias.  Neurological:  Negative for dizziness, weakness, light-headedness and headaches.  Psychiatric/Behavioral:  Negative for dysphoric mood. The patient is not nervous/anxious.      Objective:  Pulse 74   Temp (!) 96.7 F (35.9 C)   Ht 5\' 5"  (1.651 m)   Wt 165 lb (74.8 kg)   SpO2 98%   BMI 27.46 kg/m      10/21/2022   10:22 AM 05/31/2022    8:27 AM 03/17/2022    2:44 PM  BP/Weight  Systolic BP  130 05/17/2022  Diastolic BP  70 78  Wt. (Lbs) 165 160.6 161  BMI 27.46 kg/m2 25.92 kg/m2 25.99 kg/m2    Physical Exam  Diabetic Foot Exam - Simple   No data filed      Lab Results  Component Value Date   WBC 6.7 05/31/2022   HGB 11.9 05/31/2022   HCT 34.4 05/31/2022   PLT 357 05/31/2022   GLUCOSE 92 05/31/2022   CHOL 216 (H) 05/31/2022   TRIG 307 (H) 05/31/2022   HDL 49 05/31/2022   LDLCALC 114 (H) 05/31/2022   ALT 13 05/31/2022   AST 15 05/31/2022   NA 139 05/31/2022   K 4.3 05/31/2022   CL 103 05/31/2022   CREATININE 0.80 05/31/2022   BUN 11 05/31/2022   CO2 22 05/31/2022   TSH 1.840 08/16/2022      Assessment & Plan:   Problem List Items Addressed This Visit       Other   Attention deficit hyperactivity disorder (ADHD), inattentive type, mild - Primary   Generalized anxiety disorder   Other Visit Diagnoses     Other specified hypothyroidism            Begin Wegovy 0.25 mg injection weekly for 4 weeks, then increase to 0.5 mg injection for 4 weeks, then increase to 1 mg injection weekly for 4 weeks, then increase to 1.7 mg injection weekly for 4 weeks, then increase to 2 .4 mg injection weekly  Take Zofran 4 mg as needed for nausea  Increase Vyvanse to 60 mg daily We will call you with lab results Follow-up in 2-months    Follow-up: 21-months  An After Visit Summary was printed and given to the patient.  2-month, NP [facility] [phone]

## 2022-10-21 NOTE — Patient Instructions (Addendum)
Begin Wegovy 0.25 mg injection weekly for 4 weeks, then increase to 0.5 mg injection for 4 weeks, then increase to 1 mg injection weekly for 4 weeks, then increase to 1.7 mg injection weekly for 4 weeks, then increase to 2 .4 mg injection weekly  Take Zofran 4 mg as needed for nausea  Increase Vyvanse to 60 mg daily We will call you with lab results Follow-up in 21-months  Semaglutide Injection (Weight Management) What is this medication? SEMAGLUTIDE (SEM a GLOO tide) promotes weight loss. It may also be used to maintain weight loss. It works by decreasing appetite. Changes to diet and exercise are often combined with this medication. This medicine may be used for other purposes; ask your health care provider or pharmacist if you have questions. COMMON BRAND NAME(S): QJJHER What should I tell my care team before I take this medication? They need to know if you have any of these conditions: Endocrine tumors (MEN 2) or if someone in your family had these tumors Eye disease, vision problems Gallbladder disease History of depression or mental health disease History of pancreatitis Kidney disease Stomach or intestine problems Suicidal thoughts, plans, or attempt; a previous suicide attempt by you or a family member Thyroid cancer or if someone in your family had thyroid cancer An unusual or allergic reaction to semaglutide, other medications, foods, dyes, or preservatives Pregnant or trying to get pregnant Breast-feeding How should I use this medication? This medication is injected under the skin. You will be taught how to prepare and give it. Take it as directed on the prescription label. It is given once every week (every 7 days). Keep taking it unless your care team tells you to stop. It is important that you put your used needles and pens in a special sharps container. Do not put them in a trash can. If you do not have a sharps container, call your pharmacist or care team to get one. A  special MedGuide will be given to you by the pharmacist with each prescription and refill. Be sure to read this information carefully each time. This medication comes with INSTRUCTIONS FOR USE. Ask your pharmacist for directions on how to use this medication. Read the information carefully. Talk to your pharmacist or care team if you have questions. Talk to your care team about the use of this medication in children. While it may be prescribed for children as young as 12 years for selected conditions, precautions do apply. Overdosage: If you think you have taken too much of this medicine contact a poison control center or emergency room at once. NOTE: This medicine is only for you. Do not share this medicine with others. What if I miss a dose? If you miss a dose and the next scheduled dose is more than 2 days away, take the missed dose as soon as possible. If you miss a dose and the next scheduled dose is less than 2 days away, do not take the missed dose. Take the next dose at your regular time. Do not take double or extra doses. If you miss your dose for 2 weeks or more, take the next dose at your regular time or call your care team to talk about how to restart this medication. What may interact with this medication? Insulin and other medications for diabetes This list may not describe all possible interactions. Give your health care provider a list of all the medicines, herbs, non-prescription drugs, or dietary supplements you use. Also tell them if  you smoke, drink alcohol, or use illegal drugs. Some items may interact with your medicine. What should I watch for while using this medication? Visit your care team for regular checks on your progress. It may be some time before you see the benefit from this medication. Drink plenty of fluids while taking this medication. Check with your care team if you have severe diarrhea, nausea, and vomiting, or if you sweat a lot. The loss of too much body fluid may  make it dangerous for you to take this medication. This medication may affect blood sugar levels. Ask your care team if changes in diet or medications are needed if you have diabetes. If you or your family notice any changes in your behavior, such as new or worsening depression, thoughts of harming yourself, anxiety, other unusual or disturbing thoughts, or memory loss, call your care team right away. Women should inform their care team if they wish to become pregnant or think they might be pregnant. Losing weight while pregnant is not advised and may cause harm to the unborn child. Talk to your care team for more information. What side effects may I notice from receiving this medication? Side effects that you should report to your care team as soon as possible: Allergic reactions--skin rash, itching, hives, swelling of the face, lips, tongue, or throat Change in vision Dehydration--increased thirst, dry mouth, feeling faint or lightheaded, headache, dark yellow or brown urine Gallbladder problems--severe stomach pain, nausea, vomiting, fever Heart palpitations--rapid, pounding, or irregular heartbeat Kidney injury--decrease in the amount of urine, swelling of the ankles, hands, or feet Pancreatitis--severe stomach pain that spreads to your back or gets worse after eating or when touched, fever, nausea, vomiting Thoughts of suicide or self-harm, worsening mood, feelings of depression Thyroid cancer--new mass or lump in the neck, pain or trouble swallowing, trouble breathing, hoarseness Side effects that usually do not require medical attention (report to your care team if they continue or are bothersome): Diarrhea Loss of appetite Nausea Stomach pain Vomiting This list may not describe all possible side effects. Call your doctor for medical advice about side effects. You may report side effects to FDA at 1-800-FDA-1088. Where should I keep my medication? Keep out of the reach of children and  pets. Refrigeration (preferred): Store in the refrigerator. Do not freeze. Keep this medication in the original container until you are ready to take it. Get rid of any unused medication after the expiration date. Room temperature: If needed, prior to cap removal, the pen can be stored at room temperature for up to 28 days. Protect from light. If it is stored at room temperature, get rid of any unused medication after 28 days or after it expires, whichever is first. It is important to get rid of the medication as soon as you no longer need it or it is expired. You can do this in two ways: Take the medication to a medication take-back program. Check with your pharmacy or law enforcement to find a location. If you cannot return the medication, follow the directions in the [place]. NOTE: This sheet is a summary. It may not cover all possible information. If you have questions about this medicine, talk to your doctor, pharmacist, or health care provider.  2023 [employer] (2021-02-11 00:00:00)

## 2022-10-22 LAB — COMPREHENSIVE METABOLIC PANEL
ALT: 7 IU/L (ref 0–32)
AST: 11 IU/L (ref 0–40)
Albumin/Globulin Ratio: 1.6 (ref 1.2–2.2)
Albumin: 4 g/dL (ref 3.9–4.9)
Alkaline Phosphatase: 71 IU/L (ref 44–121)
BUN/Creatinine Ratio: 22 (ref 9–23)
BUN: 17 mg/dL (ref 6–24)
Bilirubin Total: <0.2 mg/dL (ref 0.0–1.2)
CO2: 23 mmol/L (ref 20–29)
Calcium: 9.1 mg/dL (ref 8.7–10.2)
Chloride: 103 mmol/L (ref 96–106)
Creatinine, Ser: 0.79 mg/dL (ref 0.57–1.00)
Globulin, Total: 2.5 g/dL (ref 1.5–4.5)
Glucose: 95 mg/dL (ref 70–99)
Potassium: 4.8 mmol/L (ref 3.5–5.2)
Sodium: 140 mmol/L (ref 134–144)
Total Protein: 6.5 g/dL (ref 6.0–8.5)
eGFR: 95 mL/min/{1.73_m2} (ref >59)

## 2022-10-22 LAB — CBC WITH DIFFERENTIAL/PLATELET
Basophils Absolute: 0 10*3/uL (ref 0.0–0.2)
Basos: 0 %
EOS (ABSOLUTE): 0.1 10*3/uL (ref 0.0–0.4)
Eos: 1 %
Hematocrit: 36.9 % (ref 34.0–46.6)
Hemoglobin: 12.5 g/dL (ref 11.1–15.9)
Immature Grans (Abs): 0 10*3/uL (ref 0.0–0.1)
Immature Granulocytes: 0 %
Lymphocytes Absolute: 2.6 10*3/uL (ref 0.7–3.1)
Lymphs: 37 %
MCH: 31.6 pg (ref 26.6–33.0)
MCHC: 33.9 g/dL (ref 31.5–35.7)
MCV: 93 fL (ref 79–97)
Monocytes Absolute: 0.5 10*3/uL (ref 0.1–0.9)
Monocytes: 8 %
Neutrophils Absolute: 3.8 10*3/uL (ref 1.4–7.0)
Neutrophils: 54 %
Platelets: 380 10*3/uL (ref 150–450)
RBC: 3.95 x10E6/uL (ref 3.77–5.28)
RDW: 11.8 % (ref 11.7–15.4)
WBC: 7.1 10*3/uL (ref 3.4–10.8)

## 2022-10-22 LAB — LIPID PANEL
Chol/HDL Ratio: 2.7 ratio (ref 0.0–4.4)
Cholesterol, Total: 173 mg/dL (ref 100–199)
HDL: 65 mg/dL (ref >39)
LDL Chol Calc (NIH): 88 mg/dL (ref 0–99)
Triglycerides: 116 mg/dL (ref 0–149)
VLDL Cholesterol Cal: 20 mg/dL (ref 5–40)

## 2022-10-22 LAB — CARDIOVASCULAR RISK ASSESSMENT

## 2022-10-22 LAB — TSH: TSH: 1.75 u[IU]/mL (ref 0.450–4.500)

## 2022-10-22 LAB — T4, FREE: Free T4: 1.2 ng/dL (ref 0.82–1.77)

## 2022-10-24 ENCOUNTER — Telehealth: Payer: Self-pay

## 2022-10-24 NOTE — Telephone Encounter (Signed)
PA for wegovy submitted and denied for [employer] via [employer]mymeds.

## 2022-11-22 ENCOUNTER — Other Ambulatory Visit: Payer: Self-pay

## 2022-11-22 DIAGNOSIS — F900 Attention-deficit hyperactivity disorder, predominantly inattentive type: Secondary | ICD-10-CM

## 2022-11-24 ENCOUNTER — Other Ambulatory Visit: Payer: Self-pay

## 2022-11-24 DIAGNOSIS — F900 Attention-deficit hyperactivity disorder, predominantly inattentive type: Secondary | ICD-10-CM

## 2022-11-24 MED ORDER — VYVANSE 60 MG PO CAPS: 60.0000 mg | ORAL_CAPSULE | ORAL | 0 refills | Status: DC

## 2022-12-06 DIAGNOSIS — J101 Influenza due to other identified influenza virus with other respiratory manifestations: Secondary | ICD-10-CM | POA: Diagnosis not present

## 2022-12-06 DIAGNOSIS — Z20822 Contact with and (suspected) exposure to covid-19: Secondary | ICD-10-CM | POA: Diagnosis not present

## 2022-12-06 DIAGNOSIS — R6889 Other general symptoms and signs: Secondary | ICD-10-CM | POA: Diagnosis not present

## 2022-12-08 ENCOUNTER — Other Ambulatory Visit: Payer: Self-pay

## 2022-12-08 DIAGNOSIS — F411 Generalized anxiety disorder: Secondary | ICD-10-CM

## 2022-12-08 DIAGNOSIS — Z79890 Hormone replacement therapy (postmenopausal): Secondary | ICD-10-CM

## 2022-12-09 MED ORDER — LAMOTRIGINE ER 100 MG PO TB24: 100.0000 mg | ORAL_TABLET | Freq: Every day | ORAL | 1 refills | Status: AC

## 2022-12-09 MED ORDER — ESTRADIOL 2 MG PO TABS: 2.0000 mg | ORAL_TABLET | Freq: Every day | ORAL | 0 refills | Status: AC

## 2022-12-26 ENCOUNTER — Other Ambulatory Visit: Payer: Self-pay

## 2022-12-26 DIAGNOSIS — F900 Attention-deficit hyperactivity disorder, predominantly inattentive type: Secondary | ICD-10-CM

## 2022-12-26 MED ORDER — VYVANSE 60 MG PO CAPS: 60.0000 mg | ORAL_CAPSULE | ORAL | 0 refills | Status: AC

## 2023-01-02 ENCOUNTER — Telehealth: Payer: Self-pay

## 2023-01-02 NOTE — Telephone Encounter (Signed)
Generic is covered by insurance, Brand is not.

## 2023-01-03 DIAGNOSIS — F900 Attention-deficit hyperactivity disorder, predominantly inattentive type: Secondary | ICD-10-CM | POA: Diagnosis not present

## 2023-01-03 DIAGNOSIS — E782 Mixed hyperlipidemia: Secondary | ICD-10-CM | POA: Diagnosis not present

## 2023-01-03 DIAGNOSIS — E034 Atrophy of thyroid (acquired): Secondary | ICD-10-CM | POA: Diagnosis not present

## 2023-01-03 DIAGNOSIS — F411 Generalized anxiety disorder: Secondary | ICD-10-CM | POA: Diagnosis not present

## 2023-01-11 ENCOUNTER — Other Ambulatory Visit: Payer: Self-pay | Admitting: Family Medicine

## 2023-03-23 DIAGNOSIS — F411 Generalized anxiety disorder: Secondary | ICD-10-CM | POA: Diagnosis not present

## 2023-03-23 DIAGNOSIS — E034 Atrophy of thyroid (acquired): Secondary | ICD-10-CM | POA: Diagnosis not present

## 2023-03-23 DIAGNOSIS — E782 Mixed hyperlipidemia: Secondary | ICD-10-CM | POA: Diagnosis not present

## 2023-03-23 DIAGNOSIS — F900 Attention-deficit hyperactivity disorder, predominantly inattentive type: Secondary | ICD-10-CM | POA: Diagnosis not present

## 2023-05-12 ENCOUNTER — Other Ambulatory Visit: Payer: Self-pay

## 2023-05-16 ENCOUNTER — Other Ambulatory Visit: Payer: Self-pay

## 2023-05-16 DIAGNOSIS — R110 Nausea: Secondary | ICD-10-CM

## 2023-06-06 ENCOUNTER — Ambulatory Visit (INDEPENDENT_AMBULATORY_CARE_PROVIDER_SITE_OTHER): Payer: BC Managed Care – PPO | Admitting: Podiatry

## 2023-06-06 DIAGNOSIS — E782 Mixed hyperlipidemia: Secondary | ICD-10-CM | POA: Diagnosis not present

## 2023-06-06 DIAGNOSIS — F900 Attention-deficit hyperactivity disorder, predominantly inattentive type: Secondary | ICD-10-CM | POA: Diagnosis not present

## 2023-06-06 DIAGNOSIS — F411 Generalized anxiety disorder: Secondary | ICD-10-CM | POA: Diagnosis not present

## 2023-06-06 DIAGNOSIS — E034 Atrophy of thyroid (acquired): Secondary | ICD-10-CM | POA: Diagnosis not present

## 2023-06-06 DIAGNOSIS — Z91199 Patient's noncompliance with other medical treatment and regimen due to unspecified reason: Secondary | ICD-10-CM

## 2023-06-06 NOTE — Progress Notes (Signed)
Pt was a no show for apt, CG 

## 2023-06-26 DIAGNOSIS — M722 Plantar fascial fibromatosis: Secondary | ICD-10-CM | POA: Diagnosis not present

## 2023-06-26 DIAGNOSIS — M7752 Other enthesopathy of left foot: Secondary | ICD-10-CM | POA: Diagnosis not present

## 2023-07-17 DIAGNOSIS — M722 Plantar fascial fibromatosis: Secondary | ICD-10-CM | POA: Diagnosis not present

## 2023-07-17 DIAGNOSIS — M79672 Pain in left foot: Secondary | ICD-10-CM | POA: Diagnosis not present

## 2023-07-17 DIAGNOSIS — M24572 Contracture, left ankle: Secondary | ICD-10-CM | POA: Diagnosis not present

## 2023-07-25 DIAGNOSIS — R1013 Epigastric pain: Secondary | ICD-10-CM | POA: Diagnosis not present

## 2023-07-25 DIAGNOSIS — K859 Acute pancreatitis without necrosis or infection, unspecified: Secondary | ICD-10-CM | POA: Diagnosis not present

## 2023-07-25 DIAGNOSIS — R079 Chest pain, unspecified: Secondary | ICD-10-CM | POA: Diagnosis not present

## 2023-07-25 DIAGNOSIS — Z90710 Acquired absence of both cervix and uterus: Secondary | ICD-10-CM | POA: Diagnosis not present

## 2023-07-25 DIAGNOSIS — M549 Dorsalgia, unspecified: Secondary | ICD-10-CM | POA: Diagnosis not present

## 2023-07-25 DIAGNOSIS — R9431 Abnormal electrocardiogram [ECG] [EKG]: Secondary | ICD-10-CM | POA: Diagnosis not present

## 2023-07-30 DIAGNOSIS — U071 COVID-19: Secondary | ICD-10-CM | POA: Diagnosis not present

## 2023-07-30 DIAGNOSIS — R0981 Nasal congestion: Secondary | ICD-10-CM | POA: Diagnosis not present

## 2023-09-26 DIAGNOSIS — E782 Mixed hyperlipidemia: Secondary | ICD-10-CM | POA: Diagnosis not present

## 2023-09-26 DIAGNOSIS — F411 Generalized anxiety disorder: Secondary | ICD-10-CM | POA: Diagnosis not present

## 2023-09-26 DIAGNOSIS — E034 Atrophy of thyroid (acquired): Secondary | ICD-10-CM | POA: Diagnosis not present

## 2023-09-26 DIAGNOSIS — F900 Attention-deficit hyperactivity disorder, predominantly inattentive type: Secondary | ICD-10-CM | POA: Diagnosis not present

## 2023-10-20 DIAGNOSIS — M722 Plantar fascial fibromatosis: Secondary | ICD-10-CM | POA: Diagnosis not present

## 2023-11-08 DIAGNOSIS — M722 Plantar fascial fibromatosis: Secondary | ICD-10-CM | POA: Diagnosis not present

## 2023-11-08 DIAGNOSIS — M7752 Other enthesopathy of left foot: Secondary | ICD-10-CM | POA: Diagnosis not present

## 2023-11-08 DIAGNOSIS — M79672 Pain in left foot: Secondary | ICD-10-CM | POA: Diagnosis not present

## 2023-11-08 DIAGNOSIS — M24573 Contracture, unspecified ankle: Secondary | ICD-10-CM | POA: Diagnosis not present

## 2023-11-14 DIAGNOSIS — R6883 Chills (without fever): Secondary | ICD-10-CM | POA: Diagnosis not present

## 2023-11-14 DIAGNOSIS — N390 Urinary tract infection, site not specified: Secondary | ICD-10-CM | POA: Diagnosis not present

## 2023-11-14 DIAGNOSIS — J069 Acute upper respiratory infection, unspecified: Secondary | ICD-10-CM | POA: Diagnosis not present

## 2023-11-14 DIAGNOSIS — Z6827 Body mass index (BMI) 27.0-27.9, adult: Secondary | ICD-10-CM | POA: Diagnosis not present

## 2023-11-14 DIAGNOSIS — J3489 Other specified disorders of nose and nasal sinuses: Secondary | ICD-10-CM | POA: Diagnosis not present

## 2023-12-14 DIAGNOSIS — M722 Plantar fascial fibromatosis: Secondary | ICD-10-CM | POA: Diagnosis not present

## 2023-12-14 DIAGNOSIS — M79672 Pain in left foot: Secondary | ICD-10-CM | POA: Diagnosis not present

## 2023-12-14 DIAGNOSIS — M24573 Contracture, unspecified ankle: Secondary | ICD-10-CM | POA: Diagnosis not present

## 2024-01-26 DIAGNOSIS — F411 Generalized anxiety disorder: Secondary | ICD-10-CM | POA: Diagnosis not present

## 2024-01-26 DIAGNOSIS — E782 Mixed hyperlipidemia: Secondary | ICD-10-CM | POA: Diagnosis not present

## 2024-01-26 DIAGNOSIS — F900 Attention-deficit hyperactivity disorder, predominantly inattentive type: Secondary | ICD-10-CM | POA: Diagnosis not present

## 2024-01-26 DIAGNOSIS — E034 Atrophy of thyroid (acquired): Secondary | ICD-10-CM | POA: Diagnosis not present

## 2024-02-09 ENCOUNTER — Other Ambulatory Visit: Payer: Self-pay

## 2024-02-09 DIAGNOSIS — Z1231 Encounter for screening mammogram for malignant neoplasm of breast: Secondary | ICD-10-CM

## 2024-04-25 DIAGNOSIS — E034 Atrophy of thyroid (acquired): Secondary | ICD-10-CM | POA: Diagnosis not present

## 2024-04-25 DIAGNOSIS — F900 Attention-deficit hyperactivity disorder, predominantly inattentive type: Secondary | ICD-10-CM | POA: Diagnosis not present

## 2024-04-25 DIAGNOSIS — E782 Mixed hyperlipidemia: Secondary | ICD-10-CM | POA: Diagnosis not present

## 2024-04-25 DIAGNOSIS — F411 Generalized anxiety disorder: Secondary | ICD-10-CM | POA: Diagnosis not present

## 2024-06-16 ENCOUNTER — Other Ambulatory Visit: Payer: Self-pay | Admitting: Family Medicine

## 2024-07-08 DIAGNOSIS — R1011 Right upper quadrant pain: Secondary | ICD-10-CM | POA: Diagnosis not present

## 2024-07-08 DIAGNOSIS — K0889 Other specified disorders of teeth and supporting structures: Secondary | ICD-10-CM | POA: Diagnosis not present

## 2024-07-08 DIAGNOSIS — Z6827 Body mass index (BMI) 27.0-27.9, adult: Secondary | ICD-10-CM | POA: Diagnosis not present

## 2024-07-08 DIAGNOSIS — R197 Diarrhea, unspecified: Secondary | ICD-10-CM | POA: Diagnosis not present

## 2024-08-22 DIAGNOSIS — Z6828 Body mass index (BMI) 28.0-28.9, adult: Secondary | ICD-10-CM | POA: Diagnosis not present

## 2024-08-22 DIAGNOSIS — F900 Attention-deficit hyperactivity disorder, predominantly inattentive type: Secondary | ICD-10-CM | POA: Diagnosis not present

## 2024-09-19 DIAGNOSIS — F900 Attention-deficit hyperactivity disorder, predominantly inattentive type: Secondary | ICD-10-CM | POA: Diagnosis not present

## 2024-09-19 DIAGNOSIS — F411 Generalized anxiety disorder: Secondary | ICD-10-CM | POA: Diagnosis not present

## 2024-09-19 DIAGNOSIS — E782 Mixed hyperlipidemia: Secondary | ICD-10-CM | POA: Diagnosis not present

## 2024-09-19 DIAGNOSIS — E034 Atrophy of thyroid (acquired): Secondary | ICD-10-CM | POA: Diagnosis not present

## 2024-09-20 ENCOUNTER — Other Ambulatory Visit (HOSPITAL_BASED_OUTPATIENT_CLINIC_OR_DEPARTMENT_OTHER): Payer: Self-pay | Admitting: Medical

## 2024-09-20 DIAGNOSIS — Z1231 Encounter for screening mammogram for malignant neoplasm of breast: Secondary | ICD-10-CM

## 2024-10-14 ENCOUNTER — Encounter (HOSPITAL_BASED_OUTPATIENT_CLINIC_OR_DEPARTMENT_OTHER): Payer: Self-pay
